# Patient Record
Sex: Female | Born: 1960 | Race: White | Hispanic: No | Marital: Married | Smoking: Never smoker
Health system: Southern US, Community
[De-identification: ages and names within clinical notes are randomized; demographics above are authoritative.]

## PROBLEM LIST (undated history)

## (undated) DIAGNOSIS — M1712 Unilateral primary osteoarthritis, left knee: Secondary | ICD-10-CM

## (undated) HISTORY — DX: Unilateral primary osteoarthritis, left knee: M17.12

---

## 2008-01-06 ENCOUNTER — Encounter: Payer: Self-pay | Admitting: Family Medicine

## 2008-05-15 ENCOUNTER — Encounter: Payer: Self-pay | Admitting: Family Medicine

## 2008-06-05 ENCOUNTER — Ambulatory Visit: Payer: Self-pay | Admitting: Family Medicine

## 2008-06-05 DIAGNOSIS — M1380 Other specified arthritis, unspecified site: Secondary | ICD-10-CM | POA: Insufficient documentation

## 2008-06-05 DIAGNOSIS — M779 Enthesopathy, unspecified: Secondary | ICD-10-CM | POA: Insufficient documentation

## 2013-04-10 ENCOUNTER — Ambulatory Visit (INDEPENDENT_AMBULATORY_CARE_PROVIDER_SITE_OTHER): Payer: 59

## 2013-04-10 ENCOUNTER — Ambulatory Visit (INDEPENDENT_AMBULATORY_CARE_PROVIDER_SITE_OTHER): Payer: 59 | Admitting: Sports Medicine

## 2013-04-10 ENCOUNTER — Encounter: Payer: Self-pay | Admitting: Sports Medicine

## 2013-04-10 VITALS — BP 154/84 | HR 72 | Wt 180.0 lb

## 2013-04-10 DIAGNOSIS — M25569 Pain in unspecified knee: Secondary | ICD-10-CM

## 2013-04-10 DIAGNOSIS — M170 Bilateral primary osteoarthritis of knee: Secondary | ICD-10-CM | POA: Insufficient documentation

## 2013-04-10 DIAGNOSIS — M25562 Pain in left knee: Secondary | ICD-10-CM

## 2013-04-10 DIAGNOSIS — M069 Rheumatoid arthritis, unspecified: Secondary | ICD-10-CM

## 2013-04-10 DIAGNOSIS — M1712 Unilateral primary osteoarthritis, left knee: Secondary | ICD-10-CM

## 2013-04-10 HISTORY — DX: Unilateral primary osteoarthritis, left knee: M17.12

## 2013-04-10 NOTE — Assessment & Plan Note (Signed)
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 The knee is stable, she has failed conservative measures. Injection as above. X-rays. Meniscal rehabilitation. Return in one month to see how things are going.

## 2013-04-10 NOTE — Progress Notes (Signed)
  Subjective:    CC: Left knee pain  HPI:  This pleasant 52 year old female recalls left knee pain that she localizes the posterior medial joint line since August of this year, she does remember taking a misstep causing rotation and valgus stress on her knee, she had immediate pain, but no mechanical symptoms. Since then pain has been localized, persistent, moderate, without mechanical symptoms. She has been using ibuprofen, some stretches, but is no better.  Past medical history, Surgical history, Family history not pertinant except as noted below, Social history, Allergies, and medications have been entered into the medical record, reviewed, and no changes needed.   Review of Systems: No headache, visual changes, nausea, vomiting, diarrhea, constipation, dizziness, abdominal pain, skin rash, fevers, chills, night sweats, swollen lymph nodes, weight loss, chest pain, body aches, joint swelling, muscle aches, shortness of breath, mood changes, visual or auditory hallucinations.  Objective:    General: Well Developed, well nourished, and in no acute distress.  Neuro: Alert and oriented x3, extra-ocular muscles intact, sensation grossly intact.  HEENT: Normocephalic, atraumatic, pupils equal round reactive to light, neck supple, no masses, no lymphadenopathy, thyroid nonpalpable.  Skin: Warm and dry, no rashes noted.  Cardiac: Regular rate and rhythm, no murmurs rubs or gallops.  Respiratory: Clear to auscultation bilaterally. Not using accessory muscles, speaking in full sentences.  Abdominal: Soft, nontender, nondistended, positive bowel sounds, no masses, no organomegaly.  Left Knee: Normal to inspection with no erythema or effusion or obvious bony abnormalities. Tender to palpation at the medial joint line and posteromedial joint line. All ligaments are stable including ACL, PCL, LCL, MCL. Negative Mcmurray's, Apley's, and Thessalonian tests. Non painful patellar compression. Patellar  glide without crepitus. Patellar and quadriceps tendons unremarkable. Hamstring and quadriceps strength is normal.   Procedure: Real-time Ultrasound Guided Injection of left knee Device: GE Logiq E  Verbal informed consent obtained.  Time-out conducted.  Noted no overlying erythema, induration, or other signs of local infection.  Skin prepped in a sterile fashion.  Local anesthesia: Topical Ethyl chloride.  With sterile technique and under real time ultrasound guidance:  2 cc Kenalog 40, 4 cc lidocaine injected easily into the suprapatellar recess. Completed without difficulty  Pain immediately resolved suggesting accurate placement of the medication.  Advised to call if fevers/chills, erythema, induration, drainage, or persistent bleeding.  Images permanently stored and available for review in the ultrasound unit.  Impression: Technically successful ultrasound guided injection.  Impression and Recommendations:    The patient was counselled, risk factors were discussed, anticipatory guidance given.

## 2013-05-08 ENCOUNTER — Ambulatory Visit: Payer: 59 | Admitting: Sports Medicine

## 2013-05-15 ENCOUNTER — Ambulatory Visit: Payer: 59 | Admitting: Sports Medicine

## 2013-05-16 ENCOUNTER — Ambulatory Visit (INDEPENDENT_AMBULATORY_CARE_PROVIDER_SITE_OTHER): Payer: 59 | Admitting: Sports Medicine

## 2013-05-16 ENCOUNTER — Encounter: Payer: Self-pay | Admitting: Sports Medicine

## 2013-05-16 VITALS — BP 142/89 | HR 82 | Wt 181.0 lb

## 2013-05-16 DIAGNOSIS — M25562 Pain in left knee: Secondary | ICD-10-CM

## 2013-05-16 DIAGNOSIS — M25569 Pain in unspecified knee: Secondary | ICD-10-CM

## 2013-05-16 DIAGNOSIS — B070 Plantar wart: Secondary | ICD-10-CM

## 2013-05-16 NOTE — Assessment & Plan Note (Signed)
Cryotherapy performed x1. Return in 2 weeks we can repeat the treatment is needed.

## 2013-05-16 NOTE — Patient Instructions (Signed)
Plantar Wart Warts are benign (noncancerous) growths of the outer skin layer. They can occur at any time in life but are most common during childhood and the teen years. Warts can occur on many skin surfaces of the body. When they occur on the underside (sole) of your foot they are called plantar warts. They often emerge in groups with several small warts encircling a larger growth. CAUSES  Human papillomavirus (HPV) is the cause of plantar warts. HPV attacks a break in the skin of the foot. Walking barefoot can lead to exposure to the wart virus. Plantar warts tend to develop over areas of pressure such as the heel and ball of the foot. Plantar warts often grow into the deeper layers of skin. They may spread to other areas of the sole but cannot spread to other areas of the body. SYMPTOMS  You may also notice a growth on the undersurface of your foot. The wart may grow directly into the sole of the foot, or rise above the surface of the skin on the sole of the foot, or both. They are most often flat from pressure. Warts generally do not cause itching but may cause pain in the area of the wart when you put weight on your foot. DIAGNOSIS  Diagnosis is made by physical examination. This means your caregiver discovers it while examining your foot.  TREATMENT  There are many ways to treat plantar warts. However, warts are very tough. Sometimes it is difficult to treat them so that they go away completely and do not grow back. Any treatment must be done regularly to work. If left untreated, most plantar warts will eventually disappear over a period of one to two years. Treatments you can do at home include:  Putting duct tape over the top of the wart (occlusion), has been found to be effective over several months. The duct tape should be removed each night and reapplied until the wart has disappeared.  Placing over-the-counter medications on top of the wart to help kill the wart virus and remove the wart  tissue (salicylic acid, cantharidin, and dichloroacetic acid ) are useful. These are called keratolytic agents. These medications make the skin soft and gradually layers will shed away. Theses compounds are usually placed on the wart each night and then covered with a band-aid. They are also available in pre-medicated band-aid form. Avoid surrounding skin when applying these liquids as these medications can burn healthy skin. The treatment may take several months of nightly use to be effective.  Cryotherapy to freeze the wart has recently become available over-the-counter for children 4 years and older. This system makes use of a soft narrow applicator connected to a bottle of compressed cold liquid that is applied directly to the wart. This medication can burn health skin and should be used with caution.  As with all over-the-counter medications, read the directions carefully before use. Treatments generally done in your caregiver's office include:  Some aggressive treatments may cause discomfort, discoloration and scaring of the surrounding skin. The risks and benefits of treatment should be discussed with your caregiver.  Freezing the wart with liquid nitrogen (cryotherapy, see above).  Burning the wart with use of very high heat (cautery).  Injecting medication into the wart.  Surgically removing or laser treatment of the wart.  Your caregiver may refer you to a dermatologist for difficult to treat, large sized or large numbers of warts. HOME CARE INSTRUCTIONS   Soak the affected area in warm water. Dry   the area completely when you are done. Remove the top layer of softened skin, then apply the chosen topical medication and reapply a bandage.  Remove the bandage daily and file excess wart tissue (pumice stone works well for this purpose). Repeat the entire process daily or every other day for weeks until the plantar wart disappears.  Several brands of salicylic acid pads are available as  over-the-counter remedies.  Pain can be relieved by wearing a doughnut bandage. This is a bandage with a hole in it. The bandage is put on with the hole over the wart. This helps take the pressure off the wart and gives pain relief. To help prevent plantar warts:  Wear shoes and socks and change them daily.  Keep feet clean and dry.  Check your feet and your children's feet regularly.  Avoid direct contact with warts on other people.  Have growths, or changes on your skin checked by your caregiver. Document Released: 07/18/2003 Document Revised: 07/20/2011 Document Reviewed: 12/26/2008 [employer] Patient Information 2014 [employer].  

## 2013-05-16 NOTE — Assessment & Plan Note (Signed)
Pain resolved. Return as needed for this.

## 2013-05-16 NOTE — Progress Notes (Signed)
  Subjective:    CC: Followup  HPI: Left knee pain: Resolved after injection.  Plantar wart: There is a single plantar wart under her right foot, there is mild pain, but is persistent, without radiation.  Past medical history, Surgical history, Family history not pertinant except as noted below, Social history, Allergies, and medications have been entered into the medical record, reviewed, and no changes needed.   Review of Systems: No fevers, chills, night sweats, weight loss, chest pain, or shortness of breath.   Objective:    General: Well Developed, well nourished, and in no acute distress.  Neuro: Alert and oriented x3, extra-ocular muscles intact, sensation grossly intact.  HEENT: Normocephalic, atraumatic, pupils equal round reactive to light, neck supple, no masses, no lymphadenopathy, thyroid nonpalpable.  Skin: Warm and dry, no rashes. Cardiac: Regular rate and rhythm, no murmurs rubs or gallops, no lower extremity edema.  Respiratory: Clear to auscultation bilaterally. Not using accessory muscles, speaking in full sentences.  Procedure:  Cryodestruction of right foot plantar wart Consent obtained and verified. Time-out conducted. Noted no overlying erythema, induration, or other signs of local infection. Completed without difficulty using Cryo-Gun. Advised to call if fevers/chills, erythema, induration, drainage, or persistent bleeding.  Impression and Recommendations:

## 2016-01-24 ENCOUNTER — Ambulatory Visit (INDEPENDENT_AMBULATORY_CARE_PROVIDER_SITE_OTHER): Payer: Managed Care, Other (non HMO) | Admitting: Sports Medicine

## 2016-01-24 DIAGNOSIS — M25562 Pain in left knee: Secondary | ICD-10-CM

## 2016-01-24 MED ORDER — MELOXICAM 15 MG PO TABS: ORAL_TABLET | ORAL | 3 refills | Status: DC

## 2016-01-24 MED ORDER — DICLOFENAC SODIUM 75 MG PO TBEC: 75.0000 mg | DELAYED_RELEASE_TABLET | Freq: Two times a day (BID) | ORAL | 3 refills | Status: DC

## 2016-01-24 NOTE — Assessment & Plan Note (Addendum)
Likely degenerative medial meniscal tear. Previous injection was almost 3 years ago. Repeat injection today, updated x-rays, return to see me in one month.

## 2016-01-24 NOTE — Progress Notes (Signed)
  Subjective:    CC: Left knee pain  HPI: This is a pleasant [age] year old female with left knee pain suspected to be osteoarthritis and a degenerative meniscal tear approximately 3 years ago, injected her and she did extremely well. She is now having a recurrence of pain, medial joint line, moderate, persistent without radiation, locking, clicking, or other mechanical symptoms, no constitutional symptoms, no trauma. She does desire repeat interventional treatment today.  Past medical history, Surgical history, Family history not pertinant except as noted below, Social history, Allergies, and medications have been entered into the medical record, reviewed, and no changes needed.   Review of Systems: No fevers, chills, night sweats, weight loss, chest pain, or shortness of breath.   Objective:    General: Well Developed, well nourished, and in no acute distress.  Neuro: Alert and oriented x3, extra-ocular muscles intact, sensation grossly intact.  HEENT: Normocephalic, atraumatic, pupils equal round reactive to light, neck supple, no masses, no lymphadenopathy, thyroid nonpalpable.  Skin: Warm and dry, no rashes. Cardiac: Regular rate and rhythm, no murmurs rubs or gallops, no lower extremity edema.  Respiratory: Clear to auscultation bilaterally. Not using accessory muscles, speaking in full sentences. Left Knee: Normal to inspection with no erythema or effusion or obvious bony abnormalities. Tender to palpation the medial joint line ROM normal in flexion and extension and lower leg rotation. Ligaments with solid consistent endpoints including ACL, PCL, LCL, MCL. Negative Mcmurray's and provocative meniscal tests. Non painful patellar compression. Patellar and quadriceps tendons unremarkable. Hamstring and quadriceps strength is normal.  Procedure: Real-time Ultrasound Guided Injection of left knee Device: GE Logiq E  Verbal informed consent obtained.  Time-out conducted.  Noted no  overlying erythema, induration, or other signs of local infection.  Skin prepped in a sterile fashion.  Local anesthesia: Topical Ethyl chloride.  With sterile technique and under real time ultrasound guidance:  1 mL kenalog 40, 2 mL lidocaine, 2 mL Marcaine injected easily Completed without difficulty  Pain immediately resolved suggesting accurate placement of the medication.  Advised to call if fevers/chills, erythema, induration, drainage, or persistent bleeding.  Images permanently stored and available for review in the ultrasound unit.  Impression: Technically successful ultrasound guided injection.  Impression and Recommendations:    Left knee pain Likely degenerative medial meniscal tear. Previous injection was almost 3 years ago. Repeat injection today, updated x-rays, return to see me in one month.  I spent 25 minutes with this patient, greater than 50% was face-to-face time counseling regarding the above diagnoses, this was separate from the time spent performing the procedure

## 2016-01-27 ENCOUNTER — Ambulatory Visit (INDEPENDENT_AMBULATORY_CARE_PROVIDER_SITE_OTHER): Payer: Managed Care, Other (non HMO)

## 2016-01-27 DIAGNOSIS — M810 Age-related osteoporosis without current pathological fracture: Secondary | ICD-10-CM

## 2016-01-27 DIAGNOSIS — M25562 Pain in left knee: Secondary | ICD-10-CM | POA: Diagnosis not present

## 2016-01-28 ENCOUNTER — Other Ambulatory Visit: Payer: Self-pay | Admitting: Sports Medicine

## 2016-01-28 MED ORDER — TRIAMCINOLONE ACETONIDE 0.5 % EX CREA: 1.0000 "application " | TOPICAL_CREAM | Freq: Two times a day (BID) | CUTANEOUS | 3 refills | Status: DC

## 2016-02-21 ENCOUNTER — Encounter: Payer: Self-pay | Admitting: Sports Medicine

## 2016-02-21 ENCOUNTER — Ambulatory Visit (INDEPENDENT_AMBULATORY_CARE_PROVIDER_SITE_OTHER): Payer: Managed Care, Other (non HMO) | Admitting: Sports Medicine

## 2016-02-21 DIAGNOSIS — M1712 Unilateral primary osteoarthritis, left knee: Secondary | ICD-10-CM | POA: Diagnosis not present

## 2016-02-21 DIAGNOSIS — M1380 Other specified arthritis, unspecified site: Secondary | ICD-10-CM | POA: Diagnosis not present

## 2016-02-21 NOTE — Assessment & Plan Note (Addendum)
Did well after injection.  Return as needed.  There was evidence of osseous demineralization on x-ray, I am going to add a bone density test.

## 2016-02-21 NOTE — Progress Notes (Signed)
  Subjective:    CC: Follow-up  HPI: This is a pleasant 55 year old feeble, we injected her knee approximately one month ago, she returns today pain-free and happy with results. X-rays did show osseous demineralization and she has some questions about this.  Past medical history:  Negative.  See flowsheet/record as well for more information.  Surgical history: Negative.  See flowsheet/record as well for more information.  Family history: Negative.  See flowsheet/record as well for more information.  Social history: Negative.  See flowsheet/record as well for more information.  Allergies, and medications have been entered into the medical record, reviewed, and no changes needed.   Review of Systems: No fevers, chills, night sweats, weight loss, chest pain, or shortness of breath.   Objective:    General: Well Developed, well nourished, and in no acute distress.  Neuro: Alert and oriented x3, extra-ocular muscles intact, sensation grossly intact.  HEENT: Normocephalic, atraumatic, pupils equal round reactive to light, neck supple, no masses, no lymphadenopathy, thyroid nonpalpable.  Skin: Warm and dry, no rashes. Cardiac: Regular rate and rhythm, no murmurs rubs or gallops, no lower extremity edema.  Respiratory: Clear to auscultation bilaterally. Not using accessory muscles, speaking in full sentences. Left Knee: Normal to inspection with no erythema or effusion or obvious bony abnormalities. Palpation normal with no warmth or joint line tenderness or patellar tenderness or condyle tenderness. ROM normal in flexion and extension and lower leg rotation. Ligaments with solid consistent endpoints including ACL, PCL, LCL, MCL. Negative Mcmurray's and provocative meniscal tests. Non painful patellar compression. Patellar and quadriceps tendons unremarkable. Hamstring and quadriceps strength is normal.  Impression and Recommendations:    Primary osteoarthritis of left knee Did well after  injection.  Return as needed.  There was evidence of osseous demineralization on x-ray, I am going to add a bone density test.  Seronegative arthritis Unlikely rheumatoid arthritis proper. Not on any immunosuppressants or Biologics. Only takes minocycline.

## 2016-02-21 NOTE — Assessment & Plan Note (Signed)
Unlikely rheumatoid arthritis proper. Not on any immunosuppressants or Biologics. Only takes minocycline.

## 2016-02-26 ENCOUNTER — Other Ambulatory Visit: Payer: Self-pay | Admitting: Sports Medicine

## 2016-02-26 ENCOUNTER — Ambulatory Visit (INDEPENDENT_AMBULATORY_CARE_PROVIDER_SITE_OTHER): Payer: Managed Care, Other (non HMO)

## 2016-02-26 DIAGNOSIS — Z13820 Encounter for screening for osteoporosis: Secondary | ICD-10-CM | POA: Diagnosis not present

## 2016-04-21 ENCOUNTER — Telehealth: Payer: Self-pay

## 2016-04-21 NOTE — Telephone Encounter (Signed)
Pt states she was billed for arthrocentesis aspiration but she did not have her knee drained. Would like to know if the claim can be resubmitted for 01/24/16.

## 2016-04-21 NOTE — Telephone Encounter (Signed)
Tell her to stay in her lane, the code for injecting a knee is the same code for arthrocentesis.  Jesus.Marland Kitchen..Marland Kitchen

## 2016-04-22 NOTE — Telephone Encounter (Signed)
Pt.notified

## 2016-05-13 ENCOUNTER — Telehealth: Payer: Self-pay | Admitting: *Deleted

## 2016-05-13 MED ORDER — MELOXICAM 15 MG PO TABS: ORAL_TABLET | ORAL | 3 refills | Status: DC

## 2016-05-13 NOTE — Telephone Encounter (Signed)
Sure, no problem. Make sure she stopped the Voltaren.

## 2016-05-13 NOTE — Telephone Encounter (Signed)
Patient left a message stating she had a reaction to Voltaren and would like to try mobic instead. She didn't say what type of reaction she had. I did call her back and left a message asking her to call back to clarify the reaction so this could be documented in her chart.  Would you like to prescribe mobic for her ?

## 2017-07-28 ENCOUNTER — Ambulatory Visit (INDEPENDENT_AMBULATORY_CARE_PROVIDER_SITE_OTHER): Payer: BC Managed Care – PPO | Admitting: Sports Medicine

## 2017-07-28 ENCOUNTER — Encounter: Payer: Self-pay | Admitting: Sports Medicine

## 2017-07-28 ENCOUNTER — Ambulatory Visit (INDEPENDENT_AMBULATORY_CARE_PROVIDER_SITE_OTHER): Payer: BC Managed Care – PPO

## 2017-07-28 DIAGNOSIS — M533 Sacrococcygeal disorders, not elsewhere classified: Secondary | ICD-10-CM | POA: Diagnosis not present

## 2017-07-28 DIAGNOSIS — M259 Joint disorder, unspecified: Secondary | ICD-10-CM

## 2017-07-28 DIAGNOSIS — M7061 Trochanteric bursitis, right hip: Secondary | ICD-10-CM | POA: Insufficient documentation

## 2017-07-28 MED ORDER — CELECOXIB 200 MG PO CAPS: ORAL_CAPSULE | ORAL | 2 refills | Status: DC

## 2017-07-28 NOTE — Assessment & Plan Note (Signed)
We will start conservatively with physical therapy, home rehab exercises, Celebrex. Has failed multiple other NSAIDs including meloxicam, diclofenac, ibuprofen, naproxen. Return in 1 month, injection if no better.

## 2017-07-28 NOTE — Assessment & Plan Note (Signed)
We will start conservatively with physical therapy, home rehab exercises, Celebrex. Return in 1 month, injection if no better.

## 2017-07-28 NOTE — Progress Notes (Signed)
Subjective:    I'm seeing this patient as a consultation for: Dr. Nani Gasser  CC: Right buttock and hip pain  HPI: For months this pleasant 57 year old female has pain that she localizes in her right low back, right buttock with radiation to the right lateral hip and thigh, painful laying on the ipsilateral side, worse with walking, better with sitting.  Not worse with Valsalva.  Nothing overtly radicular, no bowel or bladder dysfunction, saddle numbness, no constitutional symptoms, no trauma.  I reviewed the past medical history, family history, social history, surgical history, and allergies today and no changes were needed.  Please see the problem list section below in epic for further details.  Past Medical History: No past medical history on file. Past Surgical History: History reviewed. No pertinent surgical history. Social History: Social History   Socioeconomic History  . Marital status: Married    Spouse name: None  . Number of children: None  . Years of education: None  . Highest education level: None  Social Needs  . Financial resource strain: None  . Food insecurity - worry: None  . Food insecurity - inability: None  . Transportation needs - medical: None  . Transportation needs - non-medical: None  Occupational History  . None  Tobacco Use  . Smoking status: Never Smoker  . Smokeless tobacco: Never Used  Substance and Sexual Activity  . Alcohol use: None  . Drug use: None  . Sexual activity: None  Other Topics Concern  . None  Social History Narrative  . None   Family History: No family history on file. Allergies: Allergies  Allergen Reactions  . Epinephrine Shortness Of Breath  . Codeine     REACTION: SOB  . Erythromycin     REACTION: stomach upset  . Moxifloxacin     REACTION: SOB  . Penicillins     REACTION: rash  . Sulfonamide Derivatives     REACTION: rash  . Voltaren [Diclofenac Sodium]     Made patient feel fatigue and out  of it   Medications: See med rec.  Review of Systems: No headache, visual changes, nausea, vomiting, diarrhea, constipation, dizziness, abdominal pain, skin rash, fevers, chills, night sweats, weight loss, swollen lymph nodes, body aches, joint swelling, muscle aches, chest pain, shortness of breath, mood changes, visual or auditory hallucinations.   Objective:   General: Well Developed, well nourished, and in no acute distress.  Neuro:  Extra-ocular muscles intact, able to move all 4 extremities, sensation grossly intact.  Deep tendon reflexes tested were normal. Psych: Alert and oriented, mood congruent with affect. ENT:  Ears and nose appear unremarkable.  Hearing grossly normal. Neck: Unremarkable overall appearance, trachea midline.  No visible thyroid enlargement. Eyes: Conjunctivae and lids appear unremarkable.  Pupils equal and round. Skin: Warm and dry, no rashes noted.  Cardiovascular: Pulses palpable, no extremity edema. Back Exam:  Inspection: Unremarkable  Motion: Flexion 45 deg, Extension 45 deg, Side Bending to 45 deg bilaterally,  Rotation to 45 deg bilaterally  SLR laying: Negative  XSLR laying: Negative  Palpable tenderness: Right sacroiliac joint. FABER: negative. Sensory change: Gross sensation intact to all lumbar and sacral dermatomes.  Reflexes: 2+ at both patellar tendons, 2+ at achilles tendons, Babinski's downgoing.  Strength at foot  Plantar-flexion: 5/5 Dorsi-flexion: 5/5 Eversion: 5/5 Inversion: 5/5  Leg strength  Quad: 5/5 Hamstring: 5/5 Hip flexor: 5/5 Hip abductors: 5/5  Gait unremarkable. Right hip: ROM IR: 60 Deg, ER: 60 Deg, Flexion: 120 Deg,  Extension: 100 Deg, Abduction: 45 Deg, Adduction: 45 Deg Strength IR: 5/5, ER: 5/5, Flexion: 5/5, Extension: 5/5, Abduction: 4/5, Adduction: 5/5 Pelvic alignment unremarkable to inspection and palpation. Standing hip rotation and gait without trendelenburg / unsteadiness. Greater trochanter with tenderness  to palpation. No tenderness over piriformis. No SI joint tenderness and normal minimal SI movement.  Impression and Recommendations:   This case required medical decision making of moderate complexity.  Greater trochanteric bursitis, right We will start conservatively with physical therapy, home rehab exercises, Celebrex. Has failed multiple other NSAIDs including meloxicam, diclofenac, ibuprofen, naproxen. Return in 1 month, injection if no better.  Disorder of right sacroiliac joint We will start conservatively with physical therapy, home rehab exercises, Celebrex. Return in 1 month, injection if no better. ___________________________________________ Ihor Austinhomas J. Benjamin Stainhekkekandam, M.D., ABFM., CAQSM. Primary Care and Sports Medicine [facility]  Adjunct Instructor of Family Medicine  [employer]

## 2017-07-29 ENCOUNTER — Other Ambulatory Visit: Payer: Self-pay | Admitting: Sports Medicine

## 2017-08-06 ENCOUNTER — Ambulatory Visit: Payer: BC Managed Care – PPO | Admitting: Physical Therapy

## 2017-08-06 ENCOUNTER — Encounter: Payer: Self-pay | Admitting: Physical Therapy

## 2017-08-06 DIAGNOSIS — M6281 Muscle weakness (generalized): Secondary | ICD-10-CM | POA: Diagnosis not present

## 2017-08-06 DIAGNOSIS — M62830 Muscle spasm of back: Secondary | ICD-10-CM | POA: Diagnosis not present

## 2017-08-06 DIAGNOSIS — G8929 Other chronic pain: Secondary | ICD-10-CM | POA: Diagnosis not present

## 2017-08-06 DIAGNOSIS — M545 Low back pain, unspecified: Secondary | ICD-10-CM

## 2017-08-06 NOTE — Therapy (Signed)
[facility] [place] [address] [place] [address] [place], [place], [address] Phone: [phone]   Fax:  [phone]  Physical Therapy Evaluation  Patient Details  Name: Joanna Matthews MRN: [id number] Date of Birth: 1961/04/28 Referring Provider: Dr Benjamin Stain   Encounter Date: 08/06/2017  PT End of Session - 08/06/17 0934    Visit Number  1    Number of Visits  8    Date for PT Re-Evaluation  09/03/17    PT Start Time  0934    PT Stop Time  1027    PT Time Calculation (min)  53 min    Activity Tolerance  Patient tolerated treatment well       History reviewed. No pertinent past medical history.  History reviewed. No pertinent surgical history.  There were no vitals filed for this visit.   Subjective Assessment - 08/06/17 0935    Subjective  Onya reports she has had progressive Rt sided low back, SIJ/hip pain a little over a year ago.  With her job transfer she is driving more and  this has caused the pain to increase.  She reports by the end of the day it feels like buttocks are " siezing up"  She states the hip feel s a little better. catches Rt toes sometimes with walking    How long can you sit comfortably?  vary through out the day average 5-10'    How long can you stand comfortably?  ok    How long can you walk comfortably?  ok    Diagnostic tests  x-rays (-)     Patient Stated Goals  get rid of pain and the tightening up so she can walk better, garden/work in yard without pain    Currently in Pain?  No/denies         [facility] PT Assessment - 08/06/17 0001      Assessment   Medical Diagnosis  Rt SIJ and hip pain     Referring Provider  Dr Benjamin Stain    Onset Date/Surgical Date  06/08/16    Hand Dominance  Right    Next MD Visit  08/26/17    Prior Therapy  in the past      Precautions   Precautions  None    Precaution Comments  hand braces at night for carpal tunnel, orthotics in shoes      Balance Screen   Has the  patient fallen in the past 6 months  No      Prior Function   Level of Independence  Independent    Vocation  Retired    Leisure  work in [employer], being outside      Observation/Other Assessments   Focus on Therapeutic Outcomes (FOTO)   39% limited      Functional Tests   Functional tests  Squat;Lunges;Single leg stance      Squat   Comments  shift to left, Rt LE adduction      Lunges   Comments  WNL      Single Leg Stance   Comments  Lt WNL, Rt WNL for time, increased accessory motion       Posture/Postural Control   Posture/Postural Control  Postural limitations    Postural Limitations  -- posterior trunk lean in thoracic      ROM / Strength   AROM / PROM / Strength  AROM;Strength      AROM   AROM Assessment Site  Lumbar    Lumbar Flexion  to floor, slight rotation to Lt     Lumbar Extension  WNL    Lumbar - Right Rotation  WFL limited d/t soft tissue    Lumbar - Left Rotation  same as Rt       Strength   Strength Assessment Site  Hip;Knee;Ankle;Thoracic    Right/Left Hip  Right;Left    Right Hip Flexion  -- 5-/5    Right Hip Extension  4/5    Right Hip ABduction  4/5    Left Hip Flexion  5/5    Left Hip Extension  -- 5-/5    Left Hip ABduction  4+/5    Right/Left Knee  -- WNL    Right/Left Ankle  -- Lt WNL, Rt WNL except DF 4+/5    Thoracic Flexion  -- TA Lt fair (+), Rt poor      Flexibility   Soft Tissue Assessment /Muscle Length  yes    Hamstrings  WNL    Quadriceps  WNL      Palpation   Spinal mobility  WNL with CPA and Rt UPA lumbar mobs, unable to asess Lt d/t tightness of muscles    SI assessment   decreased sacral motion    Palpation comment  very tight in Lt lumbar paraspinals  and very tight and tender in Lt pririformis, some tightness and tenderness in Rt gluts and piriformis, very tight and tender in Rt upper lumbar/lower thoracic paraspinals.       Special Tests   Other special tests  (-) SLR and slump bilat.                No data recorded  Objective measurements completed on examination: See above findings.      [facility] Adult PT Treatment/Exercise - 08/06/17 0001      Exercises   Exercises  Lumbar      Lumbar Exercises: Stretches   Single Knee to Chest Stretch  Left;Right;30 seconds    Double Knee to Chest Stretch  1 rep;30 seconds    Quadruped Mid Back Stretch  -- 10 reps cat/cow & seated forward bend    Piriformis Stretch  Left;Right;30 seconds      Modalities   Modalities  Moist Heat      Moist Heat Therapy   Number Minutes Moist Heat  15 Minutes    Moist Heat Location  Lumbar Spine             PT Education - 08/06/17 1024    Education provided  Yes    Education Details  HEP DN    Person(s) Educated  Patient    Methods  Explanation;Demonstration;Handout    Comprehension  Verbalized understanding          PT Long Term Goals - 08/06/17 1108      PT LONG TERM GOAL #1   Title  I with HEP to include walking in her neighborhood ( 09/03/17)     Time  4    Period  Weeks    Status  New      PT LONG TERM GOAL #2   Title  improve FOTO =/< 36% limited ( 09/03/17)     Time  4    Period  Weeks    Status  New      PT LONG TERM GOAL #3   Title  perform her gardening/yard work with no more than 2/10 discomfort that settles down with heat ( 09/03/17)     Time  4  Period  Weeks    Status  New      PT LONG TERM GOAL #4   Title  improve hip strength =/> 5-/5 through out  (4/26/190    Time  4    Period  Weeks    Status  New      PT LONG TERM GOAL #5   Title  return demo good body mechanics when performing yard/gardening work ( 09/06/17)     Time  4    Period  Weeks    Status  New             Plan - 08/06/17 1030    Clinical Impression Statement  57 yo female with long standing h/o low back and Rt hip pain.  Her hip is a little better with new medication however back/SIJ on Rt still very sore. She has hip and core weakness, decreased mobility in her  pelvis/sacrum and a lot of muscular tightness .      Clinical Presentation  Stable    Clinical Decision Making  Low    Rehab Potential  Good    PT Frequency  2x / week    PT Duration  4 weeks    PT Treatment/Interventions  Iontophoresis 4mg /ml Dexamethasone;Dry needling;Manual techniques;Moist Heat;Traction;Ultrasound;Therapeutic activities;Patient/family education;Therapeutic exercise;Cryotherapy;Electrical Stimulation;Passive range of motion    PT Next Visit Plan  manual work to lumbar paraspinals & bilat buttocks and DN , core stability. modalities PRN    Consulted and Agree with Plan of Care  Patient       Patient will benefit from skilled therapeutic intervention in order to improve the following deficits and impairments:  Decreased strength, Decreased range of motion, Increased muscle spasms, Pain  Visit Diagnosis: Chronic right-sided low back pain without sciatica - Plan: PT plan of care cert/re-cert  Muscle weakness (generalized) - Plan: PT plan of care cert/re-cert  Muscle spasm of back - Plan: PT plan of care cert/re-cert     Problem List Patient Active Problem List   Diagnosis Date Noted  . Disorder of right sacroiliac joint 07/28/2017  . Greater trochanteric bursitis, right 07/28/2017  . Primary osteoarthritis of left knee 04/10/2013  . Seronegative arthritis 06/05/2008    Roderic Scarce PT  08/06/2017, 11:13 AM  [facility] [place] [address] [place], [place], [address] Phone: [phone]   Fax:  [phone]  Name: Dreyah Montrose MRN: [id number] Date of Birth: Jan 17, 1961

## 2017-08-06 NOTE — Patient Instructions (Addendum)
Cat / Cow Flow    Inhale, press spine toward ceiling like a Halloween cat. Keeping strength in arms and abdominals, exhale to soften spine through neutral and into cow pose. Open chest and arch back. Initiate movement between cat and cow at tailbone, one vertebrae at a time. Repeat ___10_ times. Once a day.   Supine Knee to Chest    Lie on back. Gently pull one knee toward chest. Hold __30_ seconds.  Repeat on other side. Repeat _1__ times per session. Do _1__ sessions per day.  Knee-to-Chest Stretch: Bilateral    With hands behind knees, pull both knees in to chest until a comfortable stretch is felt in lower back and buttocks. Keep back relaxed. Hold _30___ seconds. Repeat __1__ times per set. Do _1___ sets per session. Do _1___ sessions per day.  Piriformis Stretch, Supine - can use a towel under knee to help pull towards your belly.     Lie supine, one ankle crossed onto opposite knee. Holding bottom leg behind knee, gently pull legs toward chest until stretch is felt in buttock of top leg. Hold _30__ seconds. For deeper stretch gently push top knee away from body. Repeat on other leg. Repeat _1__ times per session. Do _1__ sessions per day.   Lower Back Stretch (Sitting)    Sit in chair with knees spread apart. Bend forward to floor. A comfortable stretch should be felt in lower back. Hold __30__ seconds. Repeat __1__ times per set. Do __1__ sets per session. Do __1__ sessions per day.  Trigger Point Dry Needling  . What is Trigger Point Dry Needling (DN)? o DN is a physical therapy technique used to treat muscle pain and dysfunction. Specifically, DN helps deactivate muscle trigger points (muscle knots).  o A thin filiform needle is used to penetrate the skin and stimulate the underlying trigger point. The goal is for a local twitch response (LTR) to occur and for the trigger point to relax. No medication of any kind is injected during the procedure.   . What Does Trigger  Point Dry Needling Feel Like?  o The procedure feels different for each individual patient. Some patients report that they do not actually feel the needle enter the skin and overall the process is not painful. Very mild bleeding may occur. However, many patients feel a deep cramping in the muscle in which the needle was inserted. This is the local twitch response.   Marland Kitchen. How Will I feel after the treatment? o Soreness is normal, and the onset of soreness may not occur for a few hours. Typically this soreness does not last longer than two days.  o Bruising is uncommon, however; ice can be used to decrease any possible bruising.  o In rare cases feeling tired or nauseous after the treatment is normal. In addition, your symptoms may get worse before they get better, this period will typically not last longer than 24 hours.   . What Can I do After My Treatment? o Increase your hydration by drinking more water for the next 24 hours. o You may place ice or heat on the areas treated that have become sore, however, do not use heat on inflamed or bruised areas. Heat often brings more relief post needling. o You can continue your regular activities, but vigorous activity is not recommended initially after the treatment for 24 hours. o DN is best combined with other physical therapy such as strengthening, stretching, and other therapies.

## 2017-08-09 ENCOUNTER — Ambulatory Visit: Payer: BC Managed Care – PPO | Admitting: Physical Therapy

## 2017-08-09 ENCOUNTER — Encounter: Payer: Self-pay | Admitting: Physical Therapy

## 2017-08-09 DIAGNOSIS — M6281 Muscle weakness (generalized): Secondary | ICD-10-CM | POA: Diagnosis not present

## 2017-08-09 DIAGNOSIS — M62830 Muscle spasm of back: Secondary | ICD-10-CM

## 2017-08-09 DIAGNOSIS — G8929 Other chronic pain: Secondary | ICD-10-CM | POA: Diagnosis not present

## 2017-08-09 DIAGNOSIS — M545 Low back pain: Secondary | ICD-10-CM

## 2017-08-09 NOTE — Therapy (Signed)
Joanna Matthews, [place], [address] Phone: [phone]   Fax:  [phone]  Physical Therapy Treatment  Patient Details  Name: Joanna Matthews MRN: [id number] Date of Birth: 1961/04/23 Referring Provider: Dr Dianah Field   Encounter Date: 08/09/2017  PT End of Session - 08/09/17 1404    Visit Number  2    Number of Visits  8    Date for PT Re-Evaluation  09/03/17    PT Start Time  1404    PT Stop Time  1444    PT Time Calculation (min)  40 min    Activity Tolerance  Patient tolerated treatment well       History reviewed. No pertinent past medical history.  History reviewed. No pertinent surgical history.  There were no vitals filed for this visit.  Subjective Assessment - 08/09/17 1404    Currently in Pain?  Yes    Pain Score  7     Pain Location  Buttocks    Pain Orientation  Right    Pain Type  Acute pain    Pain Onset  More than a month ago    Pain Frequency  Intermittent    Aggravating Factors   walking    Pain Relieving Factors  heat                       [facility] Adult PT Treatment/Exercise - 08/09/17 0001      Self-Care   Self-Care  Other Self-Care Comments    Other Self-Care Comments   self trigger point release to Rt buttocks with ball on wall.       Lumbar Exercises: Stretches   Single Knee to Chest Stretch  Left;Right;30 seconds    Double Knee to Chest Stretch  30 seconds    Piriformis Stretch  Left;Right;30 seconds      Modalities   Modalities  Electrical Stimulation;Moist Heat      Moist Heat Therapy   Number Minutes Moist Heat  20 Minutes    Moist Heat Location  -- buttocks      Electrical Stimulation   Electrical Stimulation Location  Rt buttocks    Electrical Stimulation Action  IFC to tolerance    Electrical Stimulation Parameters  IFC    Electrical Stimulation Goals  Pain;Tone      Manual Therapy   Manual Therapy  Soft tissue mobilization     Soft tissue mobilization  STM to Rt buttocks with TPR to the piriformis, Pt very tender in the Rt glut med.        Trigger Point Dry Needling - 08/09/17 1405    Consent Given?  Yes    Education Handout Provided  Yes    Muscles Treated Lower Body  Gluteus maximus;Gluteus minimus;Piriformis Rt side    Gluteus Maximus Response  Palpable increased muscle length;Twitch response elicited    Gluteus Minimus Response  Twitch response elicited;Palpable increased muscle length    Piriformis Response  Palpable increased muscle length;Twitch response elicited                PT Long Term Goals - 08/06/17 1108      PT LONG TERM GOAL #1   Title  I with HEP to include walking in her neighborhood ( 09/03/17)     Time  4    Period  Weeks    Status  New      PT LONG TERM GOAL #2   Title  improve FOTO =/< 36% limited ( 09/03/17)     Time  4    Period  Weeks    Status  New      PT LONG TERM GOAL #3   Title  perform her gardening/yard work with no more than 2/10 discomfort that settles down with heat ( 09/03/17)     Time  4    Period  Weeks    Status  New      PT LONG TERM GOAL #4   Title  improve hip strength =/> 5-/5 through out  (4/26/190    Time  4    Period  Weeks    Status  New      PT LONG TERM GOAL #5   Title  return demo good body mechanics when performing yard/gardening work ( 09/06/17)     Time  4    Period  Weeks    Status  New            Plan - 08/09/17 1427    Clinical Impression Statement  This is Joanna Matthews's second visit, she had a good response to manual work today with palpable increase in tissue flexibility after.  No goals met.     Rehab Potential  Good    PT Frequency  2x / week    PT Treatment/Interventions  Iontophoresis 53m/ml Dexamethasone;Dry needling;Manual techniques;Moist Heat;Traction;Ultrasound;Therapeutic activities;Patient/family education;Therapeutic exercise;Cryotherapy;Electrical Stimulation;Passive range of motion    PT Next Visit Plan   assess response to DN    Consulted and Agree with Plan of Care  Patient       Patient will benefit from skilled therapeutic intervention in order to improve the following deficits and impairments:  Decreased strength, Decreased range of motion, Increased muscle spasms, Pain  Visit Diagnosis: Chronic right-sided low back pain without sciatica  Muscle weakness (generalized)  Muscle spasm of back     Problem List Patient Active Problem List   Diagnosis Date Noted  . Disorder of right sacroiliac joint 07/28/2017  . Greater trochanteric bursitis, right 07/28/2017  . Primary osteoarthritis of left knee 04/10/2013  . Seronegative arthritis 06/05/2008    SBoneta LucksrPT  08/09/2017, 2:28 PM  [facility] [address]Phone: [phone]  Fax:  [phone] Name: Joanna ShevlinMRN: [id number] of Birth: 104/17/1962

## 2017-08-11 ENCOUNTER — Ambulatory Visit: Payer: BC Managed Care – PPO | Admitting: Physical Therapy

## 2017-08-11 DIAGNOSIS — M6281 Muscle weakness (generalized): Secondary | ICD-10-CM

## 2017-08-11 DIAGNOSIS — G8929 Other chronic pain: Secondary | ICD-10-CM

## 2017-08-11 DIAGNOSIS — M545 Low back pain: Secondary | ICD-10-CM

## 2017-08-11 DIAGNOSIS — M62830 Muscle spasm of back: Secondary | ICD-10-CM | POA: Diagnosis not present

## 2017-08-11 NOTE — Therapy (Signed)
[place], [place], [address] Phone: [phone]   Fax:  [phone]  Physical Therapy Treatment  Patient Details  Name: Joanna Matthews MRN: [id number] Date of Birth: 20-Oct-1960 Referring Provider: Dr. Dianah Field   Encounter Date: 08/11/2017  PT End of Session - 08/11/17 1141    Visit Number  3    Number of Visits  8    Date for PT Re-Evaluation  09/03/17    PT Start Time  1019    PT Stop Time  1125    PT Time Calculation (min)  66 min    Activity Tolerance  Patient tolerated treatment well;No increased pain    Behavior During Therapy  [facility] for tasks assessed/performed       No past medical history on file.  No past surgical history on file.  There were no vitals filed for this visit.  Subjective Assessment - 08/11/17 1020    Subjective  Pt reports she had 2 more episodes where her glute seized up again; once when she was stepping into shower and also when cooking.  She still has pain when she's been sitting awhile and then stands up.      Patient Stated Goals  get rid of pain and the tightening up so she can walk better, garden/work in yard without pain    Currently in Pain?  Yes    Pain Score  6     Pain Location  Buttocks    Pain Orientation  Right    Pain Descriptors / Indicators  Tightness    Aggravating Factors   walking after sitting.     Pain Relieving Factors  heat          [facility] PT Assessment - 08/11/17 0001      Assessment   Medical Diagnosis  Rt SIJ and hip pain     Referring Provider  Dr. Dianah Field    Onset Date/Surgical Date  06/08/16    Hand Dominance  Right    Next MD Visit  08/26/17      Strength   Right Hip Extension  4+/5    Right Hip ABduction  4+/5    Left Hip ABduction  -- 5-/5      Palpation   Palpation comment  RLE appears longer in supine; Rt ASIS lower than Lt and point tender; Rt sacral torsion (slight)      Special Tests    Special Tests   Leg LengthTest    Leg length test   True      True   Right  80 in. cm    Left   80.25 in. cm       [facility] Adult PT Treatment/Exercise - 08/11/17 0001      Self-Care   Other Self-Care Comments   Pt educated on self TPR with ball to Rt anterior hip and low back. Pt verbalized understanding.       Lumbar Exercises: Stretches   Passive Hamstring Stretch  Right;Left;2 reps;30 seconds    Single Knee to Chest Stretch  Left;Right;30 seconds    Piriformis Stretch  Left;Right;30 seconds      Modalities   Modalities  Electrical Stimulation;Moist Heat      Moist Heat Therapy   Number Minutes Moist Heat  20 Minutes    Moist Heat Location  -- buttocks, ant Rt hip      Electrical Stimulation   Electrical Stimulation Location  Rt glute and post hip rotators  Electrical Stimulation Action  IFC     Electrical Stimulation Parameters  to tolerance    Electrical Stimulation Goals  Pain      Manual Therapy   Manual Therapy  Muscle Energy Technique;Myofascial release;Soft tissue mobilization    Manual therapy comments  pt initially guarded, improved with time.     Soft tissue mobilization  STM to Rt glute and hip rotators.  TPR to glute med and hip rotators with RLE ER/IR     Myofascial Release  MFR to Rt iliopsoas     Muscle Energy Technique  MET to correct slight Rt sacral torsion, in prone; MET to correct elevated Rt ilium in Rt sidelying; MET to correct Rt ant rotated ilium in supine.  bridge in between MET to reset pelvis.               PT Education - 08/11/17 1136    Education provided  Yes    Education Details  self massage, TENS info.     Person(s) Educated  Patient    Methods  Explanation;Handout    Comprehension  Verbalized understanding          PT Long Term Goals - 08/06/17 1108      PT LONG TERM GOAL #1   Title  I with HEP to include walking in her neighborhood ( 09/03/17)     Time  4    Period  Weeks    Status  New      PT LONG TERM GOAL #2   Title  improve  FOTO =/< 36% limited ( 09/03/17)     Time  4    Period  Weeks    Status  New      PT LONG TERM GOAL #3   Title  perform her gardening/yard work with no more than 2/10 discomfort that settles down with heat ( 09/03/17)     Time  4    Period  Weeks    Status  New      PT LONG TERM GOAL #4   Title  improve hip strength =/> 5-/5 through out  (4/26/190    Time  4    Period  Weeks    Status  New      PT LONG TERM GOAL #5   Title  return demo good body mechanics when performing yard/gardening work ( 09/06/17)     Time  4    Period  Weeks    Status  New            Plan - 08/11/17 1127    Clinical Impression Statement  Pt had positive response to DN last session.  She presents with pelvis asymmetries; somewhat improved with MET corrections. Palpable tightness and tenderness in Rt iliopsoas; improved with MFR.  Pt may benefit from continued therapy to decrease pain and improve functional mobility.     Rehab Potential  Good    PT Frequency  2x / week    PT Duration  4 weeks    PT Treatment/Interventions  Iontophoresis 28m/ml Dexamethasone;Dry needling;Manual techniques;Moist Heat;Traction;Ultrasound;Therapeutic activities;Patient/family education;Therapeutic exercise;Cryotherapy;Electrical Stimulation;Passive range of motion    PT Next Visit Plan  DN/manual therapy.  Assess pelvis alignment.      Consulted and Agree with Plan of Care  Patient       Patient will benefit from skilled therapeutic intervention in order to improve the following deficits and impairments:  Decreased strength, Decreased range of motion, Increased muscle spasms, Pain  Visit Diagnosis:  Chronic right-sided low back pain without sciatica  Muscle weakness (generalized)  Muscle spasm of back     Problem List Patient Active Problem List   Diagnosis Date Noted  . Disorder of right sacroiliac joint 07/28/2017  . Greater trochanteric bursitis, right 07/28/2017  . Primary osteoarthritis of left knee  04/10/2013  . Seronegative arthritis 06/05/2008   Kerin Perna, PTA 08/11/17 12:47 PM  [place], [place], [address] Phone: [phone]   Fax:  [phone]  Name: Jacquilyn Seldon MRN: [id number] Date of Birth: 1960/08/04

## 2017-08-11 NOTE — Patient Instructions (Signed)
TENS UNIT  This is helpful for muscle pain and spasm.   Search and Purchase a TENS 7000 2nd edition at  [employer]  (It should be less than $30)     TENS unit instructions:   Do not shower or bathe with the unit on  Turn the unit off before removing electrodes or batteries  If the electrodes lose stickiness add a drop of water to the electrodes after they are disconnected from the unit and place on plastic sheet. If you continued to have difficulty, call the TENS unit company to purchase more electrodes.  Do not apply lotion on the skin area prior to use. Make sure the skin is clean and dry as this will help prolong the life of the electrodes.  After use, always check skin for unusual red areas, rash or other skin difficulties. If there are any skin problems, does not apply electrodes to the same area.  Never remove the electrodes from the unit by pulling the wires.  Do not use the TENS unit or electrodes other than as directed.  Do not change electrode placement without consulting your therapist or physician.  Keep at least 2 fingers between each electrode.   [facility] [address] [place], [place] [address]  [phone] (office) [phone] (fax)

## 2017-08-13 ENCOUNTER — Encounter: Payer: BC Managed Care – PPO | Admitting: Physical Therapy

## 2017-08-18 ENCOUNTER — Encounter: Payer: Self-pay | Admitting: Physical Therapy

## 2017-08-18 ENCOUNTER — Ambulatory Visit: Payer: BC Managed Care – PPO | Admitting: Physical Therapy

## 2017-08-18 DIAGNOSIS — M62830 Muscle spasm of back: Secondary | ICD-10-CM

## 2017-08-18 DIAGNOSIS — M6281 Muscle weakness (generalized): Secondary | ICD-10-CM

## 2017-08-18 DIAGNOSIS — M545 Low back pain: Secondary | ICD-10-CM | POA: Diagnosis not present

## 2017-08-18 DIAGNOSIS — G8929 Other chronic pain: Secondary | ICD-10-CM | POA: Diagnosis not present

## 2017-08-18 NOTE — Therapy (Signed)
[facility] [place], [place], [address] Phone: [phone]   Fax:  [phone]  Physical Therapy Treatment  Patient Details  Name: Capricia Serda MRN: [id number] Date of Birth: [birth year] Referring Provider: Dr. Dianah Field   Encounter Date: 08/18/2017  PT End of Session - 08/18/17 1358    Visit Number  4    Number of Visits  8    Date for PT Re-Evaluation  09/03/17    PT Start Time  8110    PT Stop Time  1449    PT Time Calculation (min)  51 min    Activity Tolerance  Patient tolerated treatment well       History reviewed. No pertinent past medical history.  History reviewed. No pertinent surgical history.  There were no vitals filed for this visit.  Subjective Assessment - 08/18/17 1359    Subjective  Not having a lot of pain in the lateral Rt leg, localized in the Rt buttocks.     Patient Stated Goals  get rid of pain and the tightening up so she can walk better, garden/work in yard without pain    Currently in Pain?  Yes    Pain Score  6     Pain Location  Buttocks    Pain Orientation  Right    Pain Descriptors / Indicators  Tightness    Pain Type  Acute pain    Pain Onset  More than a month ago    Pain Frequency  Intermittent    Aggravating Factors   initial standing.     Pain Relieving Factors  heat         [facility] PT Assessment - 08/18/17 0001      Assessment   Medical Diagnosis  Rt SIJ and hip pain                    [facility] Adult PT Treatment/Exercise - 08/18/17 0001      Lumbar Exercises: Stretches   Figure 4 Stretch  2 reps;30 seconds;With overpressure Rt      Lumbar Exercises: Sidelying   Clam  20 reps;Right reverse and regular      Modalities   Modalities  Ultrasound;Electrical Stimulation;Moist Heat      Moist Heat Therapy   Number Minutes Moist Heat  20 Minutes    Moist Heat Location  -- Rt buttock      Electrical Stimulation   Electrical  Stimulation Location  Rt glute and post hip rotators    Electrical Stimulation Action  IFC    Electrical Stimulation Parameters  to tolerance    Electrical Stimulation Goals  Pain;Tone      Ultrasound   Ultrasound Location  Rt glut med/piriformis    Ultrasound Parameters  combo US/Stim 100%, 1.45mz, 1.5w/cm2    Ultrasound Goals  Pain                  PT Long Term Goals - 08/18/17 1407      PT LONG TERM GOAL #1   Title  I with HEP to include walking in her neighborhood ( 09/03/17)     Status  On-going      PT LONG TERM GOAL #2   Title  improve FOTO =/< 36% limited ( 09/03/17)     Status  On-going      PT LONG TERM GOAL #3   Title  perform her gardening/yard work with no more than 2/10 discomfort that settles down  with heat ( 09/03/17)     Status  On-going      PT LONG TERM GOAL #4   Title  improve hip strength =/> 5-/5 through out  (4/26/190    Status  On-going      PT LONG TERM GOAL #5   Title  return demo good body mechanics when performing yard/gardening work ( 09/06/17)     Status  On-going            Plan - 08/18/17 1431    Clinical Impression Statement  This is Naylea's second week of therapy.  she has decreased pain in the Lateral Rt thigh, however still pain and tighness in the Rt piriformis.  Had good relief with todays tx, no goals met, making progress to them.  She was going to work in her yard and was advised to limit the time with this.     Rehab Potential  Good    PT Frequency  2x / week    PT Duration  4 weeks    PT Treatment/Interventions  Iontophoresis 54m/ml Dexamethasone;Dry needling;Manual techniques;Moist Heat;Traction;Ultrasound;Therapeutic activities;Patient/family education;Therapeutic exercise;Cryotherapy;Electrical Stimulation;Passive range of motion    PT Next Visit Plan  progress SIJ stablity ex and modalities/manual PRN    Consulted and Agree with Plan of Care  Patient       Patient will benefit from skilled therapeutic  intervention in order to improve the following deficits and impairments:  Decreased strength, Decreased range of motion, Increased muscle spasms, Pain  Visit Diagnosis: Chronic right-sided low back pain without sciatica  Muscle weakness (generalized)  Muscle spasm of back     Problem List Patient Active Problem List   Diagnosis Date Noted  . Disorder of right sacroiliac joint 07/28/2017  . Greater trochanteric bursitis, right 07/28/2017  . Primary osteoarthritis of left knee 04/10/2013  . Seronegative arthritis 06/05/2008    SJeral PinchPT  08/18/2017, 2:34 PM  [facility] [address]Phone: [phone]  Fax:  [phone] Name: SKatrena StehlinMRN: [id number] of Birth: [birth year]

## 2017-08-20 ENCOUNTER — Ambulatory Visit: Payer: BC Managed Care – PPO | Admitting: Physical Therapy

## 2017-08-20 DIAGNOSIS — M545 Low back pain, unspecified: Secondary | ICD-10-CM

## 2017-08-20 DIAGNOSIS — M6281 Muscle weakness (generalized): Secondary | ICD-10-CM | POA: Diagnosis not present

## 2017-08-20 DIAGNOSIS — G8929 Other chronic pain: Secondary | ICD-10-CM

## 2017-08-20 DIAGNOSIS — M62830 Muscle spasm of back: Secondary | ICD-10-CM | POA: Diagnosis not present

## 2017-08-20 NOTE — Therapy (Addendum)
[place], [place], [address] Phone: [phone]   Fax:  [phone]  Physical Therapy Treatment  Patient Details  Name: Joanna Matthews MRN: [id number] Date of Birth: [birth year] Referring Provider: Dr. Dianah Field   Encounter Date: 08/20/2017  PT End of Session - 08/20/17 1238    Visit Number  5    Number of Visits  8    Date for PT Re-Evaluation  09/03/17    PT Start Time  1150    PT Stop Time  1239    PT Time Calculation (min)  49 min    Activity Tolerance  Patient tolerated treatment well;No increased pain    Behavior During Therapy  [facility] for tasks assessed/performed       No past medical history on file.  No past surgical history on file.  There were no vitals filed for this visit.  Subjective Assessment - 08/20/17 1204    Subjective  Pt reports she feels the combo [place] made a huge difference in her Rt hip.  She has limited her gardening to 15 min.  She continues to have small twinges in her Rt buttocks after standing (from seated position), "but nothing like it use to".  She brought her TENS unit to have education on application and use.  She reported 80% improvement in symptoms.     Patient Stated Goals  get rid of pain and the tightening up so she can walk better, garden/work in yard without pain    Currently in Pain?  No/denies    Pain Score  0-No pain just "tightness".          [facility] PT Assessment - 08/20/17 0001      Observation/Other Assessments   Focus on Therapeutic Outcomes (FOTO)   35% limited       [facility] Adult PT Treatment/Exercise - 08/20/17 0001      Self-Care   Self-Care  Posture    Posture  Pt educated on posture and body mechanics when brushing teeth; pt able to return demo and verbalize understanding of improved mechanics.     Other Self-Care Comments   Pt educated on safety, parameters and application of TENS unit.  Pt verbalized understanding and was able to  return demo.       Lumbar Exercises: Stretches   Passive Hamstring Stretch  Right;Left;3 reps;30 seconds    Piriformis Stretch  Right;30 seconds;5 reps Lt side 2 reps      Lumbar Exercises: Sidelying   Clam  20 reps;Right reverse and regular      Modalities   Modalities  Ultrasound;Electrical Stimulation;Moist Heat      Moist Heat Therapy   Number Minutes Moist Heat  20 Minutes    Moist Heat Location  -- Rt buttock      Electrical Stimulation   Electrical Stimulation Location  Rt glute and post hip rotators    Electrical Stimulation Action  TENS and combo [place]    Electrical Stimulation Parameters  to tolerance    Electrical Stimulation Goals  Pain      Ultrasound   Ultrasound Location  Rt glute med/ piriformis     Ultrasound Parameters  combo US/ estim, 100%, 1.5 w/cm2, 1.0 mHz, 8 min     Ultrasound Goals  Pain                  PT Long Term Goals - 08/20/17 1250      PT LONG TERM GOAL #1  Title  I with HEP to include walking in her neighborhood ( 09/03/17)     Time  4    Period  Weeks    Status  On-going      PT LONG TERM GOAL #2   Title  improve FOTO =/< 36% limited ( 09/03/17)     Time  4    Period  Weeks    Status  Achieved      PT LONG TERM GOAL #3   Title  perform her gardening/yard work with no more than 2/10 discomfort that settles down with heat ( 09/03/17)     Time  4    Period  Weeks    Status  Achieved      PT LONG TERM GOAL #4   Title  improve hip strength =/> 5-/5 through out  (4/26/190    Time  4    Period  Weeks    Status  On-going (not tested this visit)      PT LONG TERM GOAL #5   Title  return demo good body mechanics when performing yard/gardening work ( 09/06/17)     Time  4    Period  Weeks    Status  On-going            Plan - 08/20/17 1548    Clinical Impression Statement  Pt had positive response and significant pain reduction with last session.  Pt tolerated all exercises in session today without increase in symptoms.   Pt verbalized understanding of TENS use. FOTO score improved to 35%; has met FOTO goal. Pt requests to hold therapy until she visits MD next week.     Rehab Potential  Good    PT Frequency  2x / week    PT Duration  4 weeks    PT Treatment/Interventions  Iontophoresis 10m/ml Dexamethasone;Dry needling;Manual techniques;Moist Heat;Traction;Ultrasound;Therapeutic activities;Patient/family education;Therapeutic exercise;Cryotherapy;Electrical Stimulation;Passive range of motion    PT Next Visit Plan  Will hold therapy until 09/09/17; if pt returns will continue SIJ stability exercise and body mechanics education.        Patient will benefit from skilled therapeutic intervention in order to improve the following deficits and impairments:  Decreased strength, Decreased range of motion, Increased muscle spasms, Pain  Visit Diagnosis: Chronic right-sided low back pain without sciatica  Muscle weakness (generalized)  Muscle spasm of back     Problem List Patient Active Problem List   Diagnosis Date Noted  . Disorder of right sacroiliac joint 07/28/2017  . Greater trochanteric bursitis, right 07/28/2017  . Primary osteoarthritis of left knee 04/10/2013  . Seronegative arthritis 06/05/2008   JKerin Perna PTA 08/20/17 3:54 PM  [place] [address]Phone: [phone]  Fax:  [phone] Name: Joanna VioletMRN: [id number] of Birth: 108-18-1962  PHYSICAL THERAPY DISCHARGE SUMMARY  Visits from Start of Care: 5  Current functional level related to goals / functional outcomes: See above for function at last visit   Remaining deficits: See above   Education / Equipment: HEP Plan: Patient agrees to discharge.  Patient goals were partially met. Patient is being discharged due to being pleased with the current functional level.  ?????    SJeral Pinch PT 09/21/17 9:50  AM

## 2017-08-20 NOTE — Patient Instructions (Signed)
Clam    Lie on side, legs bent 90. Open top knee to ceiling, rotating leg outward. Touch toes to ankle of bottom leg. Close knees, rotating leg inward. Maintain hip position. Repeat _20___ times. Repeat on other side. Do __1__ sessions per day.  Follow with stretch to hip  Piriformis Stretch, Sitting PNF    Sit, one ankle on opposite knee, same-side hand on crossed knee. Push down on knee, keeping spine straight. Lean torso forward until tension is felt in hamstrings and gluteals of crossed-leg side. Hold _30__ seconds. Repeat _2__ times per session. Do ___ sessions per day.   [facility] [address] [place], [place] [address]  [phone] (office) [phone] (fax)

## 2017-08-26 ENCOUNTER — Encounter: Payer: Self-pay | Admitting: Sports Medicine

## 2017-08-26 ENCOUNTER — Ambulatory Visit: Payer: BC Managed Care – PPO | Admitting: Sports Medicine

## 2017-08-26 DIAGNOSIS — M7061 Trochanteric bursitis, right hip: Secondary | ICD-10-CM

## 2017-08-26 DIAGNOSIS — M259 Joint disorder, unspecified: Secondary | ICD-10-CM

## 2017-08-26 MED ORDER — TRAMADOL HCL 50 MG PO TABS: 50.0000 mg | ORAL_TABLET | Freq: Three times a day (TID) | ORAL | 0 refills | Status: DC | PRN

## 2017-08-26 NOTE — Assessment & Plan Note (Signed)
Improved but not completely better with Celebrex and physical therapy, she will double Celebrex to 400 mg daily, adding tramadol for breakthrough pain. Return for custom molded orthotics per patient request. If insufficient improvement in 2 weeks we can do an injection.

## 2017-08-26 NOTE — Progress Notes (Signed)
Subjective:    CC: Follow-up  HPI: Joanna Matthews returns, I been treating her for sacroiliac joint pain as well as right trochanteric bursitis, she is improved considerably with physical therapy, some degree with her Celebrex but only using 1 pill daily, not yet ready to have an injection.  I reviewed the past medical history, family history, social history, surgical history, and allergies today and no changes were needed.  Please see the problem list section below in epic for further details.  Past Medical History: No past medical history on file. Past Surgical History: No past surgical history on file. Social History: Social History   Socioeconomic History  . Marital status: Married    Spouse name: Not on file  . Number of children: Not on file  . Years of education: Not on file  . Highest education level: Not on file  Occupational History  . Not on file  Social Needs  . Financial resource strain: Not on file  . Food insecurity:    Worry: Not on file    Inability: Not on file  . Transportation needs:    Medical: Not on file    Non-medical: Not on file  Tobacco Use  . Smoking status: Never Smoker  . Smokeless tobacco: Never Used  Substance and Sexual Activity  . Alcohol use: Not on file  . Drug use: Not on file  . Sexual activity: Not on file  Lifestyle  . Physical activity:    Days per week: Not on file    Minutes per session: Not on file  . Stress: Not on file  Relationships  . Social connections:    Talks on phone: Not on file    Gets together: Not on file    Attends religious service: Not on file    Active member of club or organization: Not on file    Attends meetings of clubs or organizations: Not on file    Relationship status: Not on file  Other Topics Concern  . Not on file  Social History Narrative  . Not on file   Family History: No family history on file. Allergies: Allergies  Allergen Reactions  . Epinephrine Shortness Of Breath  . Codeine    REACTION: SOB  . Erythromycin     REACTION: stomach upset  . Moxifloxacin     REACTION: SOB  . Penicillins     REACTION: rash  . Sulfonamide Derivatives     REACTION: rash  . Voltaren [Diclofenac Sodium]     Made patient feel fatigue and out of it   Medications: See med rec.  Review of Systems: No fevers, chills, night sweats, weight loss, chest pain, or shortness of breath.   Objective:    General: Well Developed, well nourished, and in no acute distress.  Neuro: Alert and oriented x3, extra-ocular muscles intact, sensation grossly intact.  HEENT: Normocephalic, atraumatic, pupils equal round reactive to light, neck supple, no masses, no lymphadenopathy, thyroid nonpalpable.  Skin: Warm and dry, no rashes. Cardiac: Regular rate and rhythm, no murmurs rubs or gallops, no lower extremity edema.  Respiratory: Clear to auscultation bilaterally. Not using accessory muscles, speaking in full sentences.  Impression and Recommendations:    Greater trochanteric bursitis, right Improved but not completely better with Celebrex and physical therapy, she will double Celebrex to 400 mg daily, adding tramadol for breakthrough pain. Return for custom molded orthotics per patient request. If insufficient improvement in 2 weeks we can do an injection.  Disorder of right sacroiliac  joint Improved but not completely better with Celebrex and physical therapy, she will double Celebrex to 400 mg daily, adding tramadol for breakthrough pain. Return for custom molded orthotics per patient request. If insufficient improvement in 2 weeks we can do an injection.  I spent 25 minutes with this patient, greater than 50% was face-to-face time counseling regarding the above diagnoses ___________________________________________ Ihor Austin. Benjamin Stain, M.D., ABFM., CAQSM. Primary Care and Sports Medicine [facility]  Adjunct Instructor of Family Medicine  [employer]

## 2017-08-26 NOTE — Assessment & Plan Note (Signed)
Improved but not completely better with Celebrex and physical therapy, she will double Celebrex to 400 mg daily, adding tramadol for breakthrough pain. Return for custom molded orthotics per patient request. If insufficient improvement in 2 weeks we can do an injection. 

## 2017-09-03 ENCOUNTER — Ambulatory Visit (INDEPENDENT_AMBULATORY_CARE_PROVIDER_SITE_OTHER): Payer: BC Managed Care – PPO | Admitting: Sports Medicine

## 2017-09-03 ENCOUNTER — Encounter: Payer: Self-pay | Admitting: Sports Medicine

## 2017-09-03 DIAGNOSIS — M259 Joint disorder, unspecified: Secondary | ICD-10-CM | POA: Diagnosis not present

## 2017-09-03 DIAGNOSIS — M7061 Trochanteric bursitis, right hip: Secondary | ICD-10-CM

## 2017-09-03 NOTE — Progress Notes (Signed)
    Patient was fitted for a : standard, cushioned, semi-rigid orthotic. The orthotic was heated and afterward the patient stood on the orthotic blank positioned on the orthotic stand. The patient was positioned in subtalar neutral position and 10 degrees of ankle dorsiflexion in a weight bearing stance. After completion of molding, a stable base was applied to the orthotic blank. The blank was ground to a stable position for weight bearing. Size: 8 Base: White EVA Additional Posting and Padding: None The patient ambulated these, and they were very comfortable.  I spent 40 minutes with this patient, greater than 50% was face-to-face time counseling regarding the below diagnosis.  ___________________________________________  J. , M.D., ABFM., CAQSM. Primary Care and Sports Medicine [facility]  Adjunct Instructor of Family Medicine  [employer]   

## 2017-09-03 NOTE — Assessment & Plan Note (Signed)
Good improvement with increasing to 400 mg of Celebrex, has not needed tramadol. Custom orthotics as above. If insufficient relief over the next month we will proceed with a right trochanteric bursa injection.

## 2017-09-03 NOTE — Assessment & Plan Note (Signed)
Good improvement with increasing to 400 mg of Celebrex, has not needed tramadol. Custom orthotics as above. If insufficient relief over the next month we will proceed with a right sacroiliac injection.

## 2017-10-18 ENCOUNTER — Other Ambulatory Visit: Payer: Self-pay | Admitting: Sports Medicine

## 2017-10-18 DIAGNOSIS — M7061 Trochanteric bursitis, right hip: Secondary | ICD-10-CM

## 2017-11-08 ENCOUNTER — Encounter: Payer: Self-pay | Admitting: Sports Medicine

## 2017-11-08 ENCOUNTER — Ambulatory Visit: Payer: BC Managed Care – PPO | Admitting: Sports Medicine

## 2017-11-08 DIAGNOSIS — B078 Other viral warts: Secondary | ICD-10-CM | POA: Diagnosis not present

## 2017-11-08 DIAGNOSIS — L989 Disorder of the skin and subcutaneous tissue, unspecified: Secondary | ICD-10-CM | POA: Diagnosis not present

## 2017-11-08 DIAGNOSIS — M259 Joint disorder, unspecified: Secondary | ICD-10-CM | POA: Diagnosis not present

## 2017-11-08 DIAGNOSIS — B079 Viral wart, unspecified: Secondary | ICD-10-CM | POA: Insufficient documentation

## 2017-11-08 NOTE — Assessment & Plan Note (Signed)
Suspect malignant lesion. Shave biopsy with hyfrecation.

## 2017-11-08 NOTE — Assessment & Plan Note (Signed)
Right sacroiliac joint injection, the previous one was 3 to 4 months ago.

## 2017-11-08 NOTE — Progress Notes (Signed)
Subjective:    CC: Back pain, skin lesions  HPI: This is a pleasant 57 year old female, I saw her 3 to 4 months ago for her sacroiliac joint pain, and injection provided fantastic relief, starting to have a recurrence of pain, localized without radiation, desires repeat interventional treatment today.  Pain is worsening, persistent.  Skin lesions: There is a suspicious lesion on her right posterior shoulder as well as on her right leg  I reviewed the past medical history, family history, social history, surgical history, and allergies today and no changes were needed.  Please see the problem list section below in epic for further details.  Past Medical History: No past medical history on file. Past Surgical History: No past surgical history on file. Social History: Social History   Socioeconomic History  . Marital status: Married    Spouse name: Not on file  . Number of children: Not on file  . Years of education: Not on file  . Highest education level: Not on file  Occupational History  . Not on file  Social Needs  . Financial resource strain: Not on file  . Food insecurity:    Worry: Not on file    Inability: Not on file  . Transportation needs:    Medical: Not on file    Non-medical: Not on file  Tobacco Use  . Smoking status: Never Smoker  . Smokeless tobacco: Never Used  Substance and Sexual Activity  . Alcohol use: Not on file  . Drug use: Not on file  . Sexual activity: Not on file  Lifestyle  . Physical activity:    Days per week: Not on file    Minutes per session: Not on file  . Stress: Not on file  Relationships  . Social connections:    Talks on phone: Not on file    Gets together: Not on file    Attends religious service: Not on file    Active member of club or organization: Not on file    Attends meetings of clubs or organizations: Not on file    Relationship status: Not on file  Other Topics Concern  . Not on file  Social History Narrative  .  Not on file   Family History: No family history on file. Allergies: Allergies  Allergen Reactions  . Epinephrine Shortness Of Breath  . Codeine     REACTION: SOB  . Erythromycin     REACTION: stomach upset  . Moxifloxacin     REACTION: SOB  . Penicillins     REACTION: rash  . Sulfonamide Derivatives     REACTION: rash  . Voltaren [Diclofenac Sodium]     Made patient feel fatigue and out of it   Medications: See med rec.  Review of Systems: No fevers, chills, night sweats, weight loss, chest pain, or shortness of breath.   Objective:    General: Well Developed, well nourished, and in no acute distress.  Neuro: Alert and oriented x3, extra-ocular muscles intact, sensation grossly intact.  HEENT: Normocephalic, atraumatic, pupils equal round reactive to light, neck supple, no masses, no lymphadenopathy, thyroid nonpalpable.  Skin: Warm and dry, no rashes.  Verruca on the right shoulder, suspicious papular lesion on the right lower leg concerning for squamous cell carcinoma. Cardiac: Regular rate and rhythm, no murmurs rubs or gallops, no lower extremity edema.  Respiratory: Clear to auscultation bilaterally. Not using accessory muscles, speaking in full sentences. Back Exam:  Inspection: Unremarkable  Motion: Flexion 45 deg, Extension  45 deg, Side Bending to 45 deg bilaterally,  Rotation to 45 deg bilaterally  SLR laying: Negative  XSLR laying: Negative  Palpable tenderness: Right sacroiliac joint. FABER: negative. Sensory change: Gross sensation intact to all lumbar and sacral dermatomes.  Reflexes: 2+ at both patellar tendons, 2+ at achilles tendons, Babinski's downgoing.  Strength at foot  Plantar-flexion: 5/5 Dorsi-flexion: 5/5 Eversion: 5/5 Inversion: 5/5  Leg strength  Quad: 5/5 Hamstring: 5/5 Hip flexor: 5/5 Hip abductors: 5/5  Gait unremarkable.  Procedure: Real-time Ultrasound Guided Injection of right sacroiliac joint Device: GE Logiq E  Verbal informed  consent obtained.  Time-out conducted.  Noted no overlying erythema, induration, or other signs of local infection.  Skin prepped in a sterile fashion.  Local anesthesia: Topical Ethyl chloride.  With sterile technique and under real time ultrasound guidance: 1 cc Kenalog 40, 2 cc lidocaine, 2 cc bupivacaine injected easily Completed without difficulty  Pain immediately resolved suggesting accurate placement of the medication.  Advised to call if fevers/chills, erythema, induration, drainage, or persistent bleeding.  Images permanently stored and available for review in the ultrasound unit.  Impression: Technically successful ultrasound guided injection.  Procedure:  Cryodestruction of right posterior shoulder verruca Consent obtained and verified. Time-out conducted. Noted no overlying erythema, induration, or other signs of local infection. Completed without difficulty using Cryo-Gun. Advised to call if fevers/chills, erythema, induration, drainage, or persistent bleeding.  Procedure: Shave biopsy of 0.5 cm right shin suspicious lesion Risks, benefits, and alternatives explained and consent obtained. Time out conducted. Surface prepped with alcohol. 2cc lidocaine with epinephine infiltrated in a field block. Adequate anesthesia ensured. Area prepped and draped in a sterile fashion. Excision performed with: DermaBlade used to shave the skin into the dermis, lesion removed in its entirety and Hyfrecator used to cauterize the base of the incision. Hemostasis achieved. Pt stable.  Impression and Recommendations:    Disorder of right sacroiliac joint Right sacroiliac joint injection, the previous one was 3 to 4 months ago.  Verruca Cryotherapy as above  Skin lesion of right leg Suspect malignant lesion. Shave biopsy with hyfrecation.  ___________________________________________ Ihor Austin. Benjamin Stain, M.D., ABFM., CAQSM. Primary Care and Sports Medicine [facility]  [facility]  Adjunct Instructor of Family Medicine  [employer]

## 2017-11-08 NOTE — Assessment & Plan Note (Signed)
Cryotherapy as above. 

## 2018-04-15 ENCOUNTER — Ambulatory Visit (INDEPENDENT_AMBULATORY_CARE_PROVIDER_SITE_OTHER): Payer: BC Managed Care – PPO

## 2018-04-15 ENCOUNTER — Ambulatory Visit: Payer: BC Managed Care – PPO | Admitting: Sports Medicine

## 2018-04-15 ENCOUNTER — Encounter: Payer: Self-pay | Admitting: Sports Medicine

## 2018-04-15 DIAGNOSIS — M545 Low back pain, unspecified: Secondary | ICD-10-CM

## 2018-04-15 DIAGNOSIS — M5137 Other intervertebral disc degeneration, lumbosacral region: Secondary | ICD-10-CM

## 2018-04-15 DIAGNOSIS — M5033 Other cervical disc degeneration, cervicothoracic region: Secondary | ICD-10-CM

## 2018-04-15 DIAGNOSIS — M170 Bilateral primary osteoarthritis of knee: Secondary | ICD-10-CM | POA: Diagnosis not present

## 2018-04-15 DIAGNOSIS — G8929 Other chronic pain: Secondary | ICD-10-CM | POA: Diagnosis not present

## 2018-04-15 NOTE — Assessment & Plan Note (Addendum)
Directly over the left quadratus lumborum, I do suspect some lumbar degenerative changes. Lumbar spine rehab exercises given, x-rays of the thoracolumbar spine. She does have a history of nephrolithiasis post lithotripsy, adding urinalysis in the lab. Return in a month, we will evaluate further if no better.

## 2018-04-15 NOTE — Progress Notes (Signed)
Subjective:    I'm seeing this patient as a consultation for: Dr. Pricilla Holm  CC: Right knee pain  HPI: For the past several weeks this pleasant 57 year old female has had pain on the anterior and medial aspect of her right knee, moderate swelling.  She took a misstep, had immediate pain, now her pain is worse with prolonged weightbearing, no gelling, no mechanical symptoms, minimal swelling.  She has been doing Celebrex, icing without much improvement.  Low back pain: History of nephrolithiasis, pain has been present for a couple of months, left quadratus lumborum, worse with laying flat at night.  No fevers, chills, dysuria, hematuria.  I reviewed the past medical history, family history, social history, surgical history, and allergies today and no changes were needed.  Please see the problem list section below in epic for further details.  Past Medical History: Past Medical History:  Diagnosis Date  . Primary osteoarthritis of left knee 04/10/2013   Past Surgical History: No past surgical history on file. Social History: Social History   Socioeconomic History  . Marital status: Married    Spouse name: Not on file  . Number of children: Not on file  . Years of education: Not on file  . Highest education level: Not on file  Occupational History  . Not on file  Social Needs  . Financial resource strain: Not on file  . Food insecurity:    Worry: Not on file    Inability: Not on file  . Transportation needs:    Medical: Not on file    Non-medical: Not on file  Tobacco Use  . Smoking status: Never Smoker  . Smokeless tobacco: Never Used  Substance and Sexual Activity  . Alcohol use: Not on file  . Drug use: Not on file  . Sexual activity: Not on file  Lifestyle  . Physical activity:    Days per week: Not on file    Minutes per session: Not on file  . Stress: Not on file  Relationships  . Social connections:    Talks on phone: Not on file    Gets together:  Not on file    Attends religious service: Not on file    Active member of club or organization: Not on file    Attends meetings of clubs or organizations: Not on file    Relationship status: Not on file  Other Topics Concern  . Not on file  Social History Narrative  . Not on file   Family History: No family history on file. Allergies: Allergies  Allergen Reactions  . Epinephrine Shortness Of Breath  . Codeine     REACTION: SOB  . Erythromycin     REACTION: stomach upset  . Moxifloxacin     REACTION: SOB  . Penicillins     REACTION: rash  . Sulfonamide Derivatives     REACTION: rash  . Voltaren [Diclofenac Sodium]     Made patient feel fatigue and out of it   Medications: See med rec.  Review of Systems: No headache, visual changes, nausea, vomiting, diarrhea, constipation, dizziness, abdominal pain, skin rash, fevers, chills, night sweats, weight loss, swollen lymph nodes, body aches, joint swelling, muscle aches, chest pain, shortness of breath, mood changes, visual or auditory hallucinations.   Objective:   General: Well Developed, well nourished, and in no acute distress.  Neuro:  Extra-ocular muscles intact, able to move all 4 extremities, sensation grossly intact.  Deep tendon reflexes tested were normal. Psych: Alert  and oriented, mood congruent with affect. ENT:  Ears and nose appear unremarkable.  Hearing grossly normal. Neck: Unremarkable overall appearance, trachea midline.  No visible thyroid enlargement. Eyes: Conjunctivae and lids appear unremarkable.  Pupils equal and round. Skin: Warm and dry, no rashes noted.  Cardiovascular: Pulses palpable, no extremity edema. Right knee: Mild swelling, tenderness at medial joint line ROM normal in flexion and extension and lower leg rotation. Ligaments with solid consistent endpoints including ACL, PCL, LCL, MCL. Negative Mcmurray's and provocative meniscal tests. Non painful patellar compression. Patellar and  quadriceps tendons unremarkable. Hamstring and quadriceps strength is normal.  Procedure: Real-time Ultrasound Guided Injection of right knee Device: GE Logiq E  Verbal informed consent obtained.  Time-out conducted.  Noted no overlying erythema, induration, or other signs of local infection.  Skin prepped in a sterile fashion.  Local anesthesia: Topical Ethyl chloride.  With sterile technique and under real time ultrasound guidance: 1 cc Kenalog 40, 2 cc lidocaine, 2 cc bupivacaine injected easily Completed without difficulty  Pain immediately resolved suggesting accurate placement of the medication.  Advised to call if fevers/chills, erythema, induration, drainage, or persistent bleeding.  Images permanently stored and available for review in the ultrasound unit.  Impression: Technically successful ultrasound guided injection.  Impression and Recommendations:   This case required medical decision making of moderate complexity.  Primary osteoarthritis of both knees Did well after a left knee injection about 5 years ago, having right knee pain now. She has been doing Celebrex, activity modification, icing without much improvement. Right knee injection today. Return in a month to evaluate response.  Left low back pain Directly over the left quadratus lumborum, I do suspect some lumbar degenerative changes. Lumbar spine rehab exercises given, x-rays of the thoracolumbar spine. She does have a history of nephrolithiasis post lithotripsy, adding urinalysis in the lab. Return in a month, we will evaluate further if no better. ___________________________________________ Ihor Austinhomas J. Benjamin Stainhekkekandam, M.D., ABFM., CAQSM. Primary Care and Sports Medicine [facility]  Adjunct Professor of Family Medicine  [employer]

## 2018-04-15 NOTE — Assessment & Plan Note (Signed)
Did well after a left knee injection about 5 years ago, having right knee pain now. She has been doing Celebrex, activity modification, icing without much improvement. Right knee injection today. Return in a month to evaluate response.

## 2018-04-17 LAB — URINALYSIS W MICROSCOPIC + REFLEX CULTURE
Bilirubin Urine: NEGATIVE
Glucose, UA: NEGATIVE
Hgb urine dipstick: NEGATIVE
Hyaline Cast: NONE SEEN /LPF
Ketones, ur: NEGATIVE
Leukocyte Esterase: NEGATIVE
Nitrites, Initial: NEGATIVE
Protein, ur: NEGATIVE
Specific Gravity, Urine: 1.019 (ref 1.001–1.03)
Squamous Epithelial / HPF: NONE SEEN /HPF (ref <=5)
pH: 7.5 (ref 5.0–8.0)

## 2018-04-17 LAB — URINE CULTURE
MICRO NUMBER:: 91468901
Result:: NO GROWTH
SPECIMEN QUALITY:: ADEQUATE

## 2018-04-17 LAB — CULTURE INDICATED

## 2018-05-13 ENCOUNTER — Ambulatory Visit: Payer: BC Managed Care – PPO | Admitting: Sports Medicine

## 2018-05-13 ENCOUNTER — Encounter: Payer: Self-pay | Admitting: Sports Medicine

## 2018-05-13 DIAGNOSIS — M170 Bilateral primary osteoarthritis of knee: Secondary | ICD-10-CM

## 2018-05-13 DIAGNOSIS — G8929 Other chronic pain: Secondary | ICD-10-CM

## 2018-05-13 DIAGNOSIS — M7061 Trochanteric bursitis, right hip: Secondary | ICD-10-CM

## 2018-05-13 DIAGNOSIS — M545 Low back pain: Secondary | ICD-10-CM | POA: Diagnosis not present

## 2018-05-13 MED ORDER — CELECOXIB 200 MG PO CAPS: ORAL_CAPSULE | ORAL | 2 refills | Status: DC

## 2018-05-13 NOTE — Assessment & Plan Note (Signed)
Referral for gastric sleeve.

## 2018-05-13 NOTE — Progress Notes (Signed)
Subjective:    CC: Follow-up  HPI: Knee osteoarthritis: Resolved after injection.  Morbid obesity: Agreeable to discuss sleeve gastrectomy.  Low back pain: With left-sided axial pain, L5-S1 DDD and spondylolisthesis on x-rays.  She is currently using hormone replacement therapy but tells me she has not tried gabapentin for hot flashes.  I reviewed the past medical history, family history, social history, surgical history, and allergies today and no changes were needed.  Please see the problem list section below in epic for further details.  Past Medical History: Past Medical History:  Diagnosis Date  . Primary osteoarthritis of left knee 04/10/2013   Past Surgical History: No past surgical history on file. Social History: Social History   Socioeconomic History  . Marital status: Married    Spouse name: Not on file  . Number of children: Not on file  . Years of education: Not on file  . Highest education level: Not on file  Occupational History  . Not on file  Social Needs  . Financial resource strain: Not on file  . Food insecurity:    Worry: Not on file    Inability: Not on file  . Transportation needs:    Medical: Not on file    Non-medical: Not on file  Tobacco Use  . Smoking status: Never Smoker  . Smokeless tobacco: Never Used  Substance and Sexual Activity  . Alcohol use: Not on file  . Drug use: Not on file  . Sexual activity: Not on file  Lifestyle  . Physical activity:    Days per week: Not on file    Minutes per session: Not on file  . Stress: Not on file  Relationships  . Social connections:    Talks on phone: Not on file    Gets together: Not on file    Attends religious service: Not on file    Active member of club or organization: Not on file    Attends meetings of clubs or organizations: Not on file    Relationship status: Not on file  Other Topics Concern  . Not on file  Social History Narrative  . Not on file   Family History: No  family history on file. Allergies: Allergies  Allergen Reactions  . Epinephrine Shortness Of Breath  . Codeine     REACTION: SOB  . Erythromycin     REACTION: stomach upset  . Moxifloxacin     REACTION: SOB  . Penicillins     REACTION: rash  . Sulfonamide Derivatives     REACTION: rash  . Voltaren [Diclofenac Sodium]     Made patient feel fatigue and out of it   Medications: See med rec.  Review of Systems: No fevers, chills, night sweats, weight loss, chest pain, or shortness of breath.   Objective:    General: Well Developed, well nourished, and in no acute distress.  Neuro: Alert and oriented x3, extra-ocular muscles intact, sensation grossly intact.  HEENT: Normocephalic, atraumatic, pupils equal round reactive to light, neck supple, no masses, no lymphadenopathy, thyroid nonpalpable.  Skin: Warm and dry, no rashes. Cardiac: Regular rate and rhythm, no murmurs rubs or gallops, no lower extremity edema.  Respiratory: Clear to auscultation bilaterally. Not using accessory muscles, speaking in full sentences.  Impression and Recommendations:    Morbid obesity (HCC) Referral for gastric sleeve.   Primary osteoarthritis of both knees Better after injection at the last visit. Refilling Celebrex.  Left low back pain L5-S1 DDD with anterolisthesis. Rehab exercises  given. If persistent discomfort after a month of exercises we will do a L-spine MRI for epidural planning. She has not tried gabapentin for her hot flashes, went straight to hormone replacement, I have advised her to talk to her PCP about discontinuing hormone replacement and try gabapentin instead which would help her back discomfort as well.  I spent 40 minutes with this patient, greater than 50% was face-to-face time counseling regarding the above diagnoses, specifically the benefits of aggressive weight loss. ___________________________________________ Ihor Austinhomas J. Benjamin Stainhekkekandam, M.D., ABFM., CAQSM. Primary  Care and Sports Medicine [facility]  Adjunct Professor of Family Medicine  [employer]

## 2018-05-13 NOTE — Assessment & Plan Note (Signed)
Better after injection at the last visit. Refilling Celebrex.

## 2018-05-13 NOTE — Assessment & Plan Note (Addendum)
L5-S1 DDD with anterolisthesis. Rehab exercises given. If persistent discomfort after a month of exercises we will do a L-spine MRI for epidural planning. She has not tried gabapentin for her hot flashes, went straight to hormone replacement, I have advised her to talk to her PCP about discontinuing hormone replacement and try gabapentin instead which would help her back discomfort as well.

## 2018-06-10 ENCOUNTER — Ambulatory Visit: Payer: BC Managed Care – PPO | Admitting: Sports Medicine

## 2018-06-20 ENCOUNTER — Ambulatory Visit: Payer: BC Managed Care – PPO | Admitting: Sports Medicine

## 2018-06-20 DIAGNOSIS — M170 Bilateral primary osteoarthritis of knee: Secondary | ICD-10-CM | POA: Diagnosis not present

## 2018-06-20 DIAGNOSIS — G8929 Other chronic pain: Secondary | ICD-10-CM | POA: Diagnosis not present

## 2018-06-20 DIAGNOSIS — M545 Low back pain, unspecified: Secondary | ICD-10-CM

## 2018-06-20 NOTE — Assessment & Plan Note (Signed)
L5-S1 DDD with anterolisthesis. Stable currently, not yet ready to proceed with interventional planning MRI.

## 2018-06-20 NOTE — Assessment & Plan Note (Signed)
Bariatrics referral placed but not yet ready to proceed.

## 2018-06-20 NOTE — Progress Notes (Signed)
    Patient was fitted for a : standard, cushioned, semi-rigid orthotic. The orthotic was heated and afterward the patient stood on the orthotic blank positioned on the orthotic stand. The patient was positioned in subtalar neutral position and 10 degrees of ankle dorsiflexion in a weight bearing stance. After completion of molding, a stable base was applied to the orthotic blank. The blank was ground to a stable position for weight bearing. Size: 8 Base: [facility] Doctor, hospital and Padding: None The patient ambulated these, and they were very comfortable.  I spent 25 minutes with this patient, greater than 50% was face-to-face time counseling regarding the diagnosis, treatment options, pathophysiology and prognosis of the diagnoses listed below.  ___________________________________________ Ihor Austin. Benjamin Stain, M.D., ABFM., CAQSM. Primary Care and Sports Medicine [facility]  Adjunct Instructor of Family Medicine  [employer]

## 2018-06-20 NOTE — Assessment & Plan Note (Signed)
Overall doing well, post injection, Celebrex. Custom orthotics today.

## 2019-01-24 ENCOUNTER — Encounter: Payer: Self-pay | Admitting: Sports Medicine

## 2019-01-24 ENCOUNTER — Other Ambulatory Visit: Payer: Self-pay

## 2019-01-24 ENCOUNTER — Ambulatory Visit: Payer: BC Managed Care – PPO | Admitting: Sports Medicine

## 2019-01-24 ENCOUNTER — Ambulatory Visit (INDEPENDENT_AMBULATORY_CARE_PROVIDER_SITE_OTHER): Payer: BC Managed Care – PPO

## 2019-01-24 DIAGNOSIS — R590 Localized enlarged lymph nodes: Secondary | ICD-10-CM | POA: Diagnosis not present

## 2019-01-24 DIAGNOSIS — M7521 Bicipital tendinitis, right shoulder: Secondary | ICD-10-CM | POA: Diagnosis not present

## 2019-01-24 NOTE — Assessment & Plan Note (Signed)
Present for 3 months, x-rays, ultrasound guided injection, rehab exercises given. Return to see me in a month.

## 2019-01-24 NOTE — Assessment & Plan Note (Signed)
Right posterior chain, follow-up with PCP for this.

## 2019-01-24 NOTE — Progress Notes (Signed)
Subjective:    CC: Neck pain, lump on neck  HPI: This is a very pleasant [id number] year old female, for the past several months she is noted a lump on the back of her neck, minimally tender, comes and goes.  She is agreeable to discuss this with her PCP.  She has also noted for the past 3 months pain in her anterior elbow, worse with flexion, supination.  Moderate, persistent, localized without radiation.  I reviewed the past medical history, family history, social history, surgical history, and allergies today and no changes were needed.  Please see the problem list section below in epic for further details.  Past Medical History: Past Medical History:  Diagnosis Date  . Primary osteoarthritis of left knee 04/10/2013   Past Surgical History: No past surgical history on file. Social History: Social History   Socioeconomic History  . Marital status: Married    Spouse name: Not on file  . Number of children: Not on file  . Years of education: Not on file  . Highest education level: Not on file  Occupational History  . Not on file  Social Needs  . Financial resource strain: Not on file  . Food insecurity    Worry: Not on file    Inability: Not on file  . Transportation needs    Medical: Not on file    Non-medical: Not on file  Tobacco Use  . Smoking status: Never Smoker  . Smokeless tobacco: Never Used  Substance and Sexual Activity  . Alcohol use: Not on file  . Drug use: Not on file  . Sexual activity: Not on file  Lifestyle  . Physical activity    Days per week: Not on file    Minutes per session: Not on file  . Stress: Not on file  Relationships  . Social Musicianconnections    Talks on phone: Not on file    Gets together: Not on file    Attends religious service: Not on file    Active member of club or organization: Not on file    Attends meetings of clubs or organizations: Not on file    Relationship status: Not on file  Other Topics Concern  . Not on file  Social  History Narrative  . Not on file   Family History: No family history on file. Allergies: Allergies  Allergen Reactions  . Epinephrine Shortness Of Breath  . Codeine     REACTION: SOB  . Erythromycin     REACTION: stomach upset  . Moxifloxacin     REACTION: SOB  . Penicillins     REACTION: rash  . Sulfonamide Derivatives     REACTION: rash  . Voltaren [Diclofenac Sodium]     Made patient feel fatigue and out of it   Medications: See med rec.  Review of Systems: No fevers, chills, night sweats, weight loss, chest pain, or shortness of breath.   Objective:    General: Well Developed, well nourished, and in no acute distress.  Neuro: Alert and oriented x3, extra-ocular muscles intact, sensation grossly intact.  HEENT: Normocephalic, atraumatic, pupils equal round reactive to light, neck supple, no masses, mild right posterior cervical lymphadenopathy, subcentimeter lymph node, minimally tender, thyroid nonpalpable.  Skin: Warm and dry, no rashes. Cardiac: Regular rate and rhythm, no murmurs rubs or gallops, no lower extremity edema.  Respiratory: Clear to auscultation bilaterally. Not using accessory muscles, speaking in full sentences. Right elbow: Unremarkable to inspection. Range of motion full pronation, supination, flexion,  extension. Strength is full to all of the above directions Stable to varus, valgus stress. Negative moving valgus stress test. Tender palpation at the distal biceps insertion with reproduction of pain with resisted supination of the forearm. Ulnar nerve does not sublux. Negative cubital tunnel Tinel's.  Procedure: Real-time Ultrasound Guided injection of the right distal biceps tendon Device: GE Logiq E  Verbal informed consent obtained.  Time-out conducted.  Noted no overlying erythema, induration, or other signs of local infection.  Skin prepped in a sterile fashion.  Local anesthesia: Topical Ethyl chloride.  With sterile technique and under  real time ultrasound guidance:  We used a dorsal approach, the forearm was pronated, I then advanced a 25-gauge needle right to the insertion of the distal biceps tendon in the radial tuberosity and injected 1 cc Kenalog 40, 1 cc lidocaine, 1 cc bupivacaine. Completed without difficulty  Pain immediately resolved suggesting accurate placement of the medication.  Advised to call if fevers/chills, erythema, induration, drainage, or persistent bleeding.  Images permanently stored and available for review in the ultrasound unit.  Impression: Technically successful ultrasound guided injection.  Impression and Recommendations:    Biceps tendinitis, right, distal Present for 3 months, x-rays, ultrasound guided injection, rehab exercises given. Return to see me in a month.  Posterior cervical lymphadenopathy Right posterior chain, follow-up with PCP for this.   ___________________________________________ Gwen Her. Dianah Field, M.D., ABFM., CAQSM. Primary Care and Sports Medicine [facility]  Adjunct Professor of [employer]

## 2019-02-21 ENCOUNTER — Ambulatory Visit: Payer: BC Managed Care – PPO | Admitting: Sports Medicine

## 2019-02-22 ENCOUNTER — Ambulatory Visit: Payer: BC Managed Care – PPO | Admitting: Sports Medicine

## 2019-02-22 ENCOUNTER — Other Ambulatory Visit: Payer: Self-pay

## 2019-02-22 ENCOUNTER — Encounter: Payer: Self-pay | Admitting: Sports Medicine

## 2019-02-22 DIAGNOSIS — M7521 Bicipital tendinitis, right shoulder: Secondary | ICD-10-CM

## 2019-02-22 NOTE — Progress Notes (Signed)
Subjective:    CC: Follow-up  HPI: Right distal biceps tendinitis: Resolved after injection.  I reviewed the past medical history, family history, social history, surgical history, and allergies today and no changes were needed.  Please see the problem list section below in epic for further details.  Past Medical History: Past Medical History:  Diagnosis Date  . Primary osteoarthritis of left knee 04/10/2013   Past Surgical History: No past surgical history on file. Social History: Social History   Socioeconomic History  . Marital status: Married    Spouse name: Not on file  . Number of children: Not on file  . Years of education: Not on file  . Highest education level: Not on file  Occupational History  . Not on file  Social Needs  . Financial resource strain: Not on file  . Food insecurity    Worry: Not on file    Inability: Not on file  . Transportation needs    Medical: Not on file    Non-medical: Not on file  Tobacco Use  . Smoking status: Never Smoker  . Smokeless tobacco: Never Used  Substance and Sexual Activity  . Alcohol use: Not on file  . Drug use: Not on file  . Sexual activity: Not on file  Lifestyle  . Physical activity    Days per week: Not on file    Minutes per session: Not on file  . Stress: Not on file  Relationships  . Social Musician on phone: Not on file    Gets together: Not on file    Attends religious service: Not on file    Active member of club or organization: Not on file    Attends meetings of clubs or organizations: Not on file    Relationship status: Not on file  Other Topics Concern  . Not on file  Social History Narrative  . Not on file   Family History: No family history on file. Allergies: Allergies  Allergen Reactions  . Epinephrine Shortness Of Breath  . Codeine     REACTION: SOB  . Erythromycin     REACTION: stomach upset  . Moxifloxacin     REACTION: SOB  . Penicillins     REACTION: rash  .  Sulfonamide Derivatives     REACTION: rash  . Voltaren [Diclofenac Sodium]     Made patient feel fatigue and out of it   Medications: See med rec.  Review of Systems: No fevers, chills, night sweats, weight loss, chest pain, or shortness of breath.   Objective:    General: Well Developed, well nourished, and in no acute distress.  Neuro: Alert and oriented x3, extra-ocular muscles intact, sensation grossly intact.  HEENT: Normocephalic, atraumatic, pupils equal round reactive to light, neck supple, no masses, no lymphadenopathy, thyroid nonpalpable.  Skin: Warm and dry, no rashes. Cardiac: Regular rate and rhythm, no murmurs rubs or gallops, no lower extremity edema.  Respiratory: Clear to auscultation bilaterally. Not using accessory muscles, speaking in full sentences. Right elbow: Unremarkable to inspection. Range of motion full pronation, supination, flexion, extension. Strength is full to all of the above directions Stable to varus, valgus stress. Negative moving valgus stress test. No discrete areas of tenderness to palpation. Ulnar nerve does not sublux. Negative cubital tunnel Tinel's.  Impression and Recommendations:    Biceps tendinitis, right, distal After 3 months of pain we did a distal biceps insertion injection from a dorsal approach. She returns today pain-free. I  have advised her to try to sleep with her elbows extended, return as needed.   ___________________________________________ Gwen Her. Dianah Field, M.D., ABFM., CAQSM. Primary Care and Sports Medicine [facility]  Adjunct Professor of [employer]

## 2019-02-22 NOTE — Assessment & Plan Note (Signed)
After 3 months of pain we did a distal biceps insertion injection from a dorsal approach. She returns today pain-free. I have advised her to try to sleep with her elbows extended, return as needed.

## 2019-10-28 ENCOUNTER — Other Ambulatory Visit: Payer: Self-pay | Admitting: Sports Medicine

## 2019-10-28 DIAGNOSIS — M7061 Trochanteric bursitis, right hip: Secondary | ICD-10-CM

## 2020-02-21 IMAGING — DX DG ELBOW COMPLETE 3+V*R*
4 series · 4 of 4 positions shown · non-contrast
Comparison: None.

CLINICAL DATA: Anterior elbow pain 3 months.  No injury.

EXAM:
RIGHT ELBOW - COMPLETE 3+ VIEW

[elbow ap]
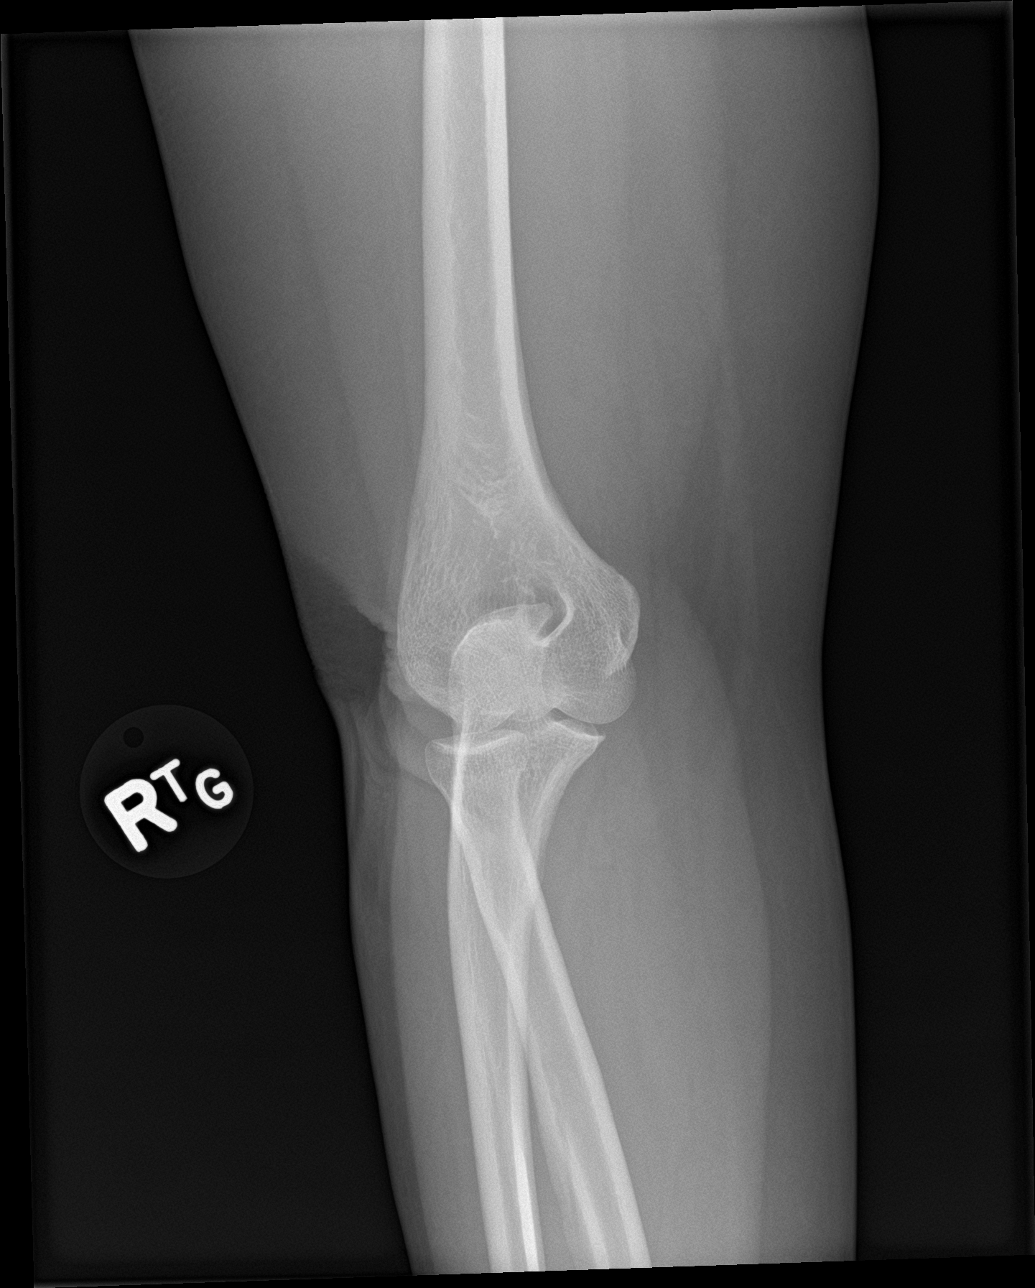

[elbow lat]
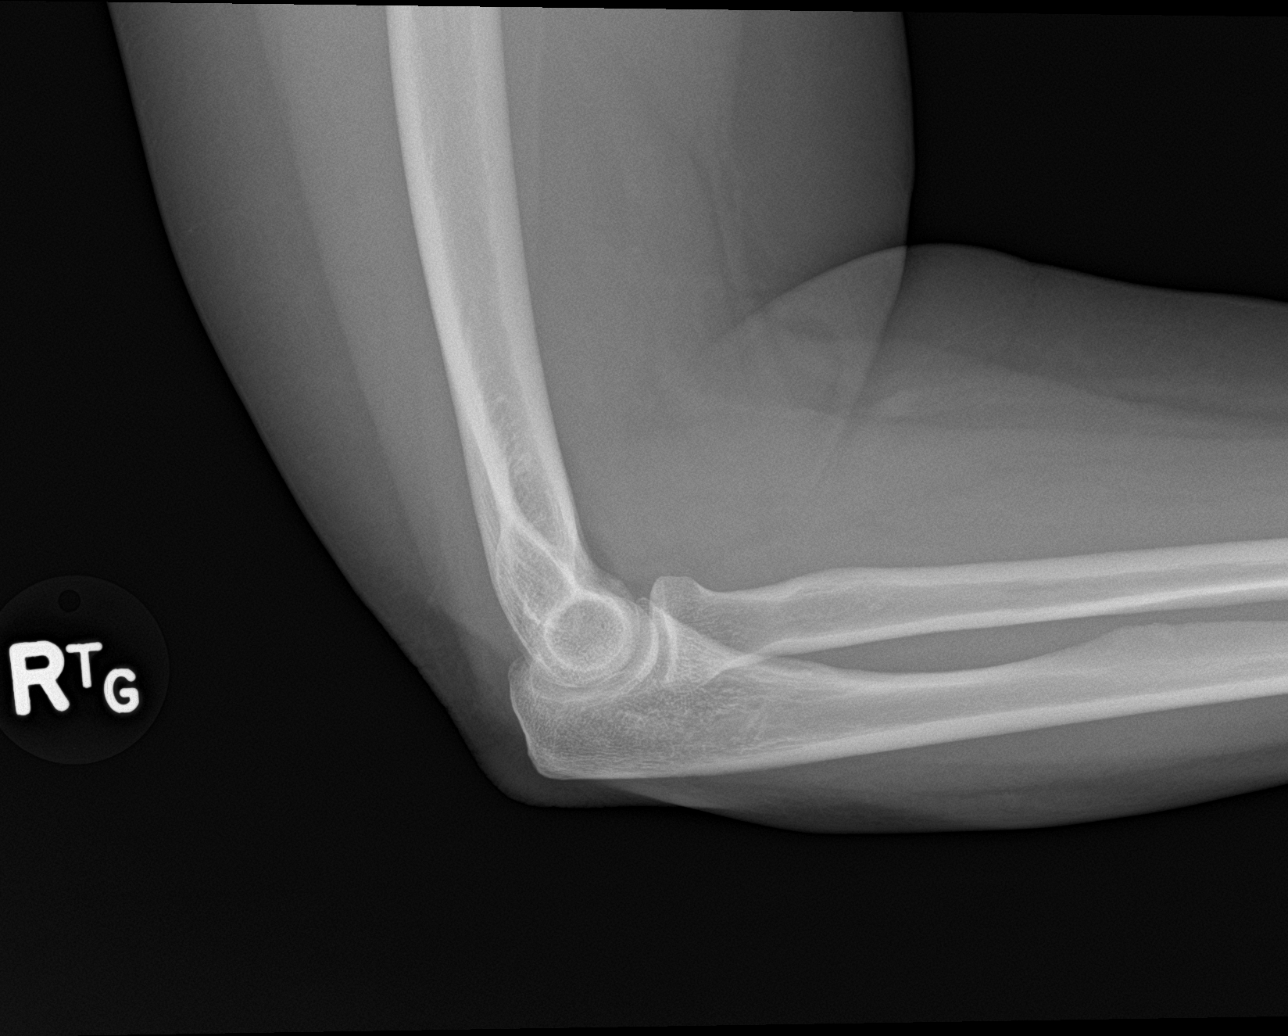

[elbow obl (1 of 2)]
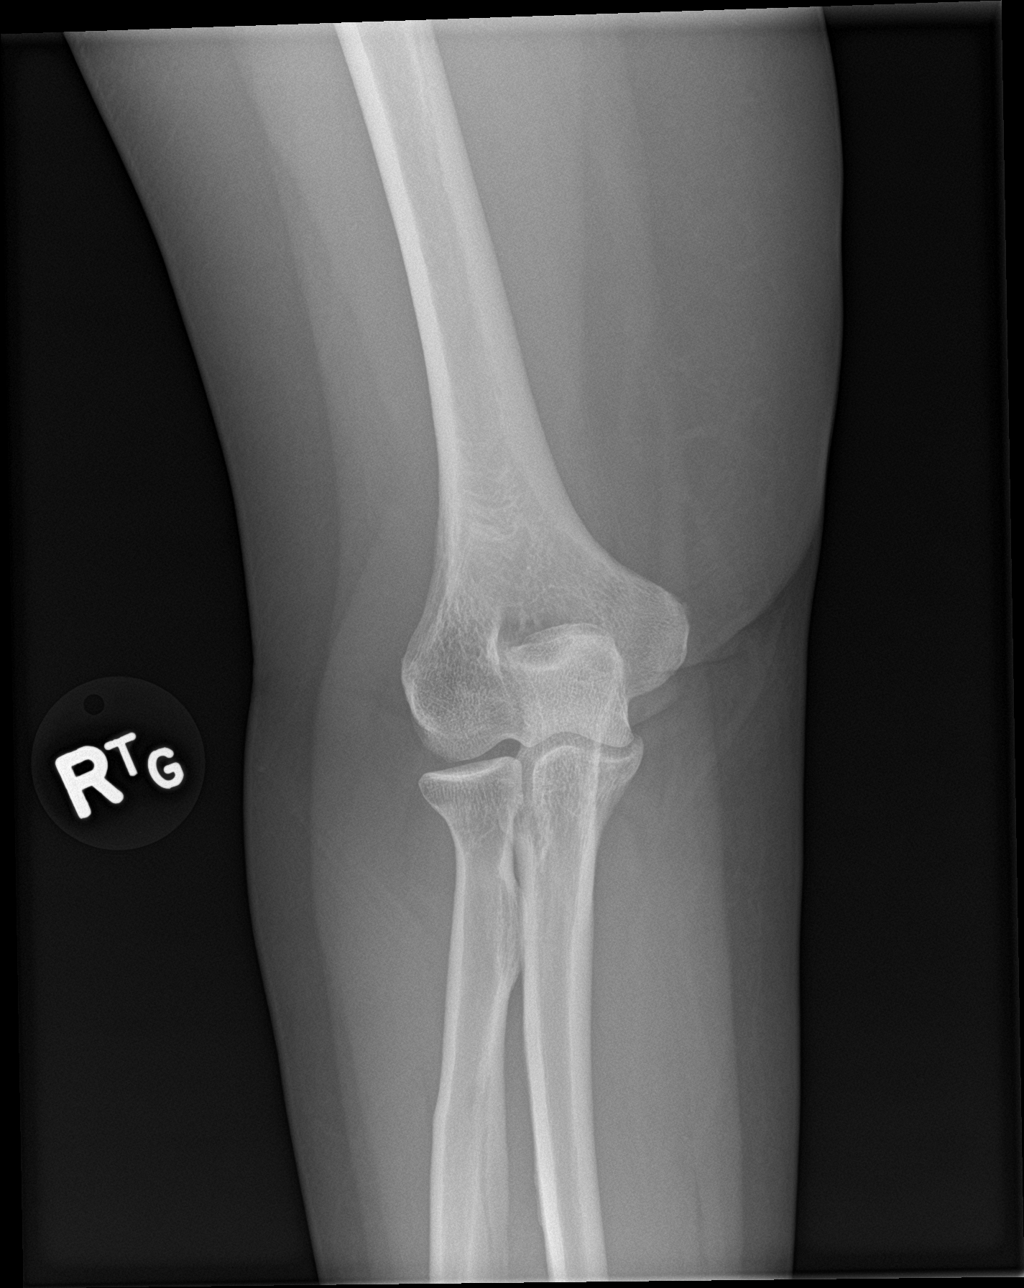

[elbow obl (2 of 2)]
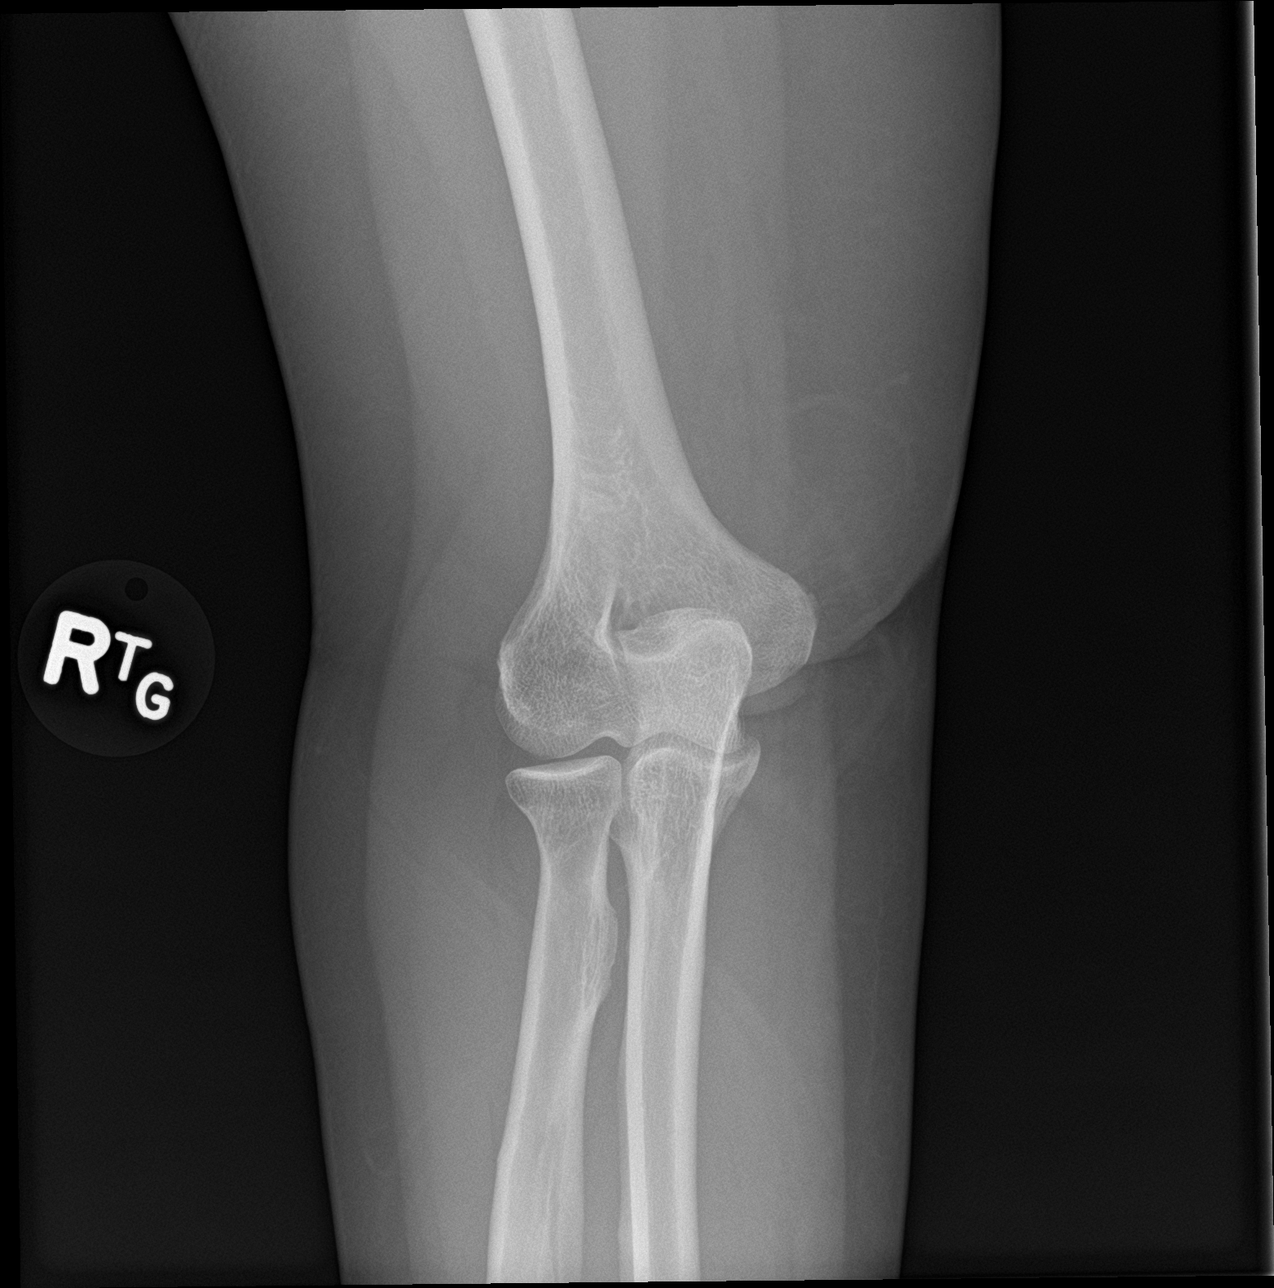

[4 of 4 positions shown; findings below may reference images not displayed]

FINDINGS: There is no evidence of fracture, dislocation, or joint effusion.
There is no evidence of arthropathy or other focal bone abnormality.
Soft tissues are unremarkable.
IMPRESSION: Negative.

## 2020-07-17 ENCOUNTER — Other Ambulatory Visit: Payer: Self-pay | Admitting: Sports Medicine

## 2020-07-17 DIAGNOSIS — M7061 Trochanteric bursitis, right hip: Secondary | ICD-10-CM

## 2020-10-11 ENCOUNTER — Other Ambulatory Visit: Payer: Self-pay | Admitting: Sports Medicine

## 2020-10-11 DIAGNOSIS — M7061 Trochanteric bursitis, right hip: Secondary | ICD-10-CM

## 2020-10-23 ENCOUNTER — Other Ambulatory Visit: Payer: Self-pay

## 2020-10-23 ENCOUNTER — Ambulatory Visit (INDEPENDENT_AMBULATORY_CARE_PROVIDER_SITE_OTHER): Payer: Self-pay

## 2020-10-23 ENCOUNTER — Ambulatory Visit (INDEPENDENT_AMBULATORY_CARE_PROVIDER_SITE_OTHER): Payer: Self-pay | Admitting: Sports Medicine

## 2020-10-23 DIAGNOSIS — M170 Bilateral primary osteoarthritis of knee: Secondary | ICD-10-CM

## 2020-10-23 NOTE — Assessment & Plan Note (Signed)
This is a very pleasant 60 year old female with known bilateral knee osteoarthritis, her last injection was several years ago, she does well with Celebrex as well. She is having a recurrence of pain in both knees, left worse than right, medial joint line. Today we injected her left knee. Her insurance is through [employer] and so she is self-pay with [facility]. For this reason I think it is probably prudent to have Dr. Laurian Brim do her knee steroid injections for insurance purposes. I am sure he would be happy to do this.

## 2020-10-23 NOTE — Progress Notes (Signed)
    Procedures performed today:    Procedure: Real-time Ultrasound Guided injection of the left knee Device: Samsung HS60  Verbal informed consent obtained.  Time-out conducted.  Noted no overlying erythema, induration, or other signs of local infection.  Skin prepped in a sterile fashion.  Local anesthesia: Topical Ethyl chloride.  With sterile technique and under real time ultrasound guidance: Trace effusion, 1 cc Kenalog 40, 2 cc lidocaine, 2 cc bupivacaine injected easily Completed without difficulty  Advised to call if fevers/chills, erythema, induration, drainage, or persistent bleeding.  Images permanently stored and available for review in PACS.  Impression: Technically successful ultrasound guided injection.  Independent interpretation of notes and tests performed by another provider:   None.  Brief History, Exam, Impression, and Recommendations:    Primary osteoarthritis of both knees This is a very pleasant 60 year old female with known bilateral knee osteoarthritis, her last injection was several years ago, she does well with Celebrex as well. She is having a recurrence of pain in both knees, left worse than right, medial joint line. Today we injected her left knee. Her insurance is through [place] and so she is self-pay with [facility]. For this reason I think it is probably prudent to have Dr. Laurian Brim do her knee steroid injections for insurance purposes. I am sure he would be happy to do this.    ___________________________________________ Ihor Austin. Benjamin Stain, M.D., ABFM., CAQSM. Primary Care and Sports Medicine [facility]  Adjunct Instructor of Family Medicine  [employer]

## 2020-11-18 ENCOUNTER — Ambulatory Visit (INDEPENDENT_AMBULATORY_CARE_PROVIDER_SITE_OTHER): Payer: Self-pay | Admitting: Sports Medicine

## 2020-11-18 ENCOUNTER — Other Ambulatory Visit: Payer: Self-pay

## 2020-11-18 ENCOUNTER — Ambulatory Visit (INDEPENDENT_AMBULATORY_CARE_PROVIDER_SITE_OTHER): Payer: Self-pay

## 2020-11-18 DIAGNOSIS — M170 Bilateral primary osteoarthritis of knee: Secondary | ICD-10-CM

## 2020-11-18 NOTE — Progress Notes (Signed)
    Procedures performed today:    Procedure: Real-time Ultrasound Guided injection of the right knee Device: Samsung HS60  Verbal informed consent obtained.  Time-out conducted.  Noted no overlying erythema, induration, or other signs of local infection.  Skin prepped in a sterile fashion.  Local anesthesia: Topical Ethyl chloride.  With sterile technique and under real time ultrasound guidance: Noted trace effusion, 1 cc Kenalog 40, 2 cc lidocaine, 2 cc bupivacaine injected easily Completed without difficulty  Advised to call if fevers/chills, erythema, induration, drainage, or persistent bleeding.  Images permanently stored and available for review in PACS.  Impression: Technically successful ultrasound guided injection.  Independent interpretation of notes and tests performed by another provider:   None.  Brief History, Exam, Impression, and Recommendations:    Primary osteoarthritis of both knees This is a pleasant 60 year old female, known bilateral knee osteoarthritis, left knee injection was done back in June, she is doing well, her insurance is through [place] and she is self-pay with [facility] so I had suggested that she have Dr. Laurian Brim do her knee injections to prevent out of network charges, she is back with increasing pain in her right knee requesting me to do the injection. We injected her right knee today, return as needed.  Chronic process with exacerbation and pharmacologic intervention.    ___________________________________________ Ihor Austin. Benjamin Stain, M.D., ABFM., CAQSM. Primary Care and Sports Medicine [facility]  Adjunct Instructor of Family Medicine  [employer]

## 2020-11-18 NOTE — Assessment & Plan Note (Addendum)
This is a pleasant 60 year old female, known bilateral knee osteoarthritis, left knee injection was done back in June, she is doing well, her insurance is through [place] and she is self-pay with [facility] so I had suggested that she have Dr. Laurian Brim do her knee injections to prevent out of network charges, she is back with increasing pain in her right knee requesting me to do the injection. We injected her right knee today, return as needed.  Chronic process with exacerbation and pharmacologic intervention.

## 2021-01-05 ENCOUNTER — Other Ambulatory Visit: Payer: Self-pay | Admitting: Sports Medicine

## 2021-01-05 DIAGNOSIS — M7061 Trochanteric bursitis, right hip: Secondary | ICD-10-CM

## 2021-02-18 ENCOUNTER — Telehealth: Payer: Self-pay

## 2021-02-18 DIAGNOSIS — M259 Joint disorder, unspecified: Secondary | ICD-10-CM

## 2021-02-18 NOTE — Telephone Encounter (Signed)
Referral placed.

## 2021-02-18 NOTE — Telephone Encounter (Signed)
Patient called requesting a referral from [place] to [facility] ([facility]) as her insurance only covers [facility] facilities. They told her she needed a referral from [place] before they would schedule an appt.

## 2021-02-18 NOTE — Telephone Encounter (Signed)
Left msg that referral had been placed and info was forwarded to medical records so they could get the info sent over as well.

## 2021-04-03 ENCOUNTER — Other Ambulatory Visit: Payer: Self-pay | Admitting: Sports Medicine

## 2021-04-03 DIAGNOSIS — M7061 Trochanteric bursitis, right hip: Secondary | ICD-10-CM

## 2021-07-03 ENCOUNTER — Other Ambulatory Visit: Payer: Self-pay | Admitting: Sports Medicine

## 2021-07-03 DIAGNOSIS — M7061 Trochanteric bursitis, right hip: Secondary | ICD-10-CM

## 2021-08-01 ENCOUNTER — Other Ambulatory Visit: Payer: Self-pay | Admitting: Sports Medicine

## 2021-08-01 DIAGNOSIS — M7061 Trochanteric bursitis, right hip: Secondary | ICD-10-CM
